# Patient Record
Sex: Male | Born: 1955
Health system: Southern US, Community
[De-identification: ages and names within clinical notes are randomized; demographics above are authoritative.]

## PROBLEM LIST (undated history)

## (undated) DIAGNOSIS — D229 Melanocytic nevi, unspecified: Secondary | ICD-10-CM

## (undated) DIAGNOSIS — C801 Malignant (primary) neoplasm, unspecified: Secondary | ICD-10-CM

## (undated) DIAGNOSIS — T7840XA Allergy, unspecified, initial encounter: Secondary | ICD-10-CM

## (undated) DIAGNOSIS — M1990 Unspecified osteoarthritis, unspecified site: Secondary | ICD-10-CM

## (undated) DIAGNOSIS — Z5189 Encounter for other specified aftercare: Secondary | ICD-10-CM

## (undated) DIAGNOSIS — G4730 Sleep apnea, unspecified: Secondary | ICD-10-CM

## (undated) DIAGNOSIS — C439 Malignant melanoma of skin, unspecified: Secondary | ICD-10-CM

## (undated) DIAGNOSIS — D049 Carcinoma in situ of skin, unspecified: Secondary | ICD-10-CM

## (undated) HISTORY — DX: Unspecified osteoarthritis, unspecified site: M19.90

## (undated) HISTORY — DX: Sleep apnea, unspecified: G47.30

## (undated) HISTORY — DX: Malignant melanoma of skin, unspecified: C43.9

## (undated) HISTORY — DX: Allergy, unspecified, initial encounter: T78.40XA

## (undated) HISTORY — DX: Carcinoma in situ of skin, unspecified: D04.9

## (undated) HISTORY — PX: COLONOSCOPY: SHX174

## (undated) HISTORY — DX: Malignant (primary) neoplasm, unspecified: C80.1

## (undated) HISTORY — DX: Melanocytic nevi, unspecified: D22.9

## (undated) HISTORY — PX: POLYPECTOMY: SHX149

## (undated) HISTORY — DX: Encounter for other specified aftercare: Z51.89

---
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
## 2001-10-14 ENCOUNTER — Emergency Department (HOSPITAL_COMMUNITY): Admission: EM | Admit: 2001-10-14 | Discharge: 2001-10-14 | Payer: Self-pay

## 2005-12-03 ENCOUNTER — Ambulatory Visit: Payer: Self-pay | Admitting: Gastroenterology

## 2005-12-16 ENCOUNTER — Encounter (INDEPENDENT_AMBULATORY_CARE_PROVIDER_SITE_OTHER): Payer: Self-pay | Admitting: *Deleted

## 2005-12-16 ENCOUNTER — Ambulatory Visit (HOSPITAL_COMMUNITY): Admission: RE | Admit: 2005-12-16 | Discharge: 2005-12-16 | Payer: Self-pay | Admitting: Gastroenterology

## 2005-12-21 ENCOUNTER — Ambulatory Visit: Payer: Self-pay | Admitting: Gastroenterology

## 2009-07-03 ENCOUNTER — Ambulatory Visit (HOSPITAL_COMMUNITY): Admission: RE | Admit: 2009-07-03 | Discharge: 2009-07-03 | Payer: Self-pay | Admitting: Specialist

## 2009-08-08 ENCOUNTER — Ambulatory Visit (HOSPITAL_BASED_OUTPATIENT_CLINIC_OR_DEPARTMENT_OTHER): Admission: RE | Admit: 2009-08-08 | Discharge: 2009-08-08 | Payer: Self-pay | Admitting: Specialist

## 2009-08-18 ENCOUNTER — Ambulatory Visit (HOSPITAL_COMMUNITY): Admission: RE | Admit: 2009-08-18 | Discharge: 2009-08-18 | Payer: Self-pay | Admitting: Specialist

## 2009-08-18 ENCOUNTER — Ambulatory Visit: Payer: Self-pay | Admitting: Surgery

## 2010-04-26 ENCOUNTER — Encounter: Payer: Self-pay | Admitting: Internal Medicine

## 2010-06-23 LAB — POCT HEMOGLOBIN-HEMACUE: Hemoglobin: 15.3 g/dL (ref 13.0–17.0)

## 2010-08-07 ENCOUNTER — Emergency Department (HOSPITAL_BASED_OUTPATIENT_CLINIC_OR_DEPARTMENT_OTHER)
Admission: EM | Admit: 2010-08-07 | Discharge: 2010-08-07 | Disposition: A | Discharge status: Home / Self Care | Payer: 59 | Attending: Emergency Medicine | Admitting: Emergency Medicine

## 2010-08-07 DIAGNOSIS — Z23 Encounter for immunization: Secondary | ICD-10-CM | POA: Insufficient documentation

## 2010-08-07 DIAGNOSIS — Z203 Contact with and (suspected) exposure to rabies: Secondary | ICD-10-CM | POA: Insufficient documentation

## 2010-08-10 ENCOUNTER — Inpatient Hospital Stay (INDEPENDENT_AMBULATORY_CARE_PROVIDER_SITE_OTHER)
Admission: RE | Admit: 2010-08-10 | Discharge: 2010-08-10 | Disposition: A | Discharge status: Home / Self Care | Payer: 59 | Source: Ambulatory Visit | Attending: Family Medicine | Admitting: Family Medicine

## 2010-08-10 DIAGNOSIS — Z23 Encounter for immunization: Secondary | ICD-10-CM

## 2010-08-14 ENCOUNTER — Inpatient Hospital Stay (INDEPENDENT_AMBULATORY_CARE_PROVIDER_SITE_OTHER)
Admission: RE | Admit: 2010-08-14 | Discharge: 2010-08-14 | Disposition: A | Discharge status: Home / Self Care | Payer: 59 | Source: Ambulatory Visit

## 2010-08-14 DIAGNOSIS — Z23 Encounter for immunization: Secondary | ICD-10-CM

## 2010-08-21 ENCOUNTER — Inpatient Hospital Stay (INDEPENDENT_AMBULATORY_CARE_PROVIDER_SITE_OTHER)
Admission: RE | Admit: 2010-08-21 | Discharge: 2010-08-21 | Disposition: A | Discharge status: Home / Self Care | Payer: 59 | Source: Ambulatory Visit

## 2010-08-21 DIAGNOSIS — Z23 Encounter for immunization: Secondary | ICD-10-CM

## 2010-08-21 NOTE — Assessment & Plan Note (Signed)
[facility]                           GASTROENTEROLOGY OFFICE NOTE   Lance, Davis                     MRN:          [id number]  DATE:12/03/2005                            DOB:          1955/09/29    REASON FOR REFERRAL:  Dr. Chilton Si asked me to evaluate Lance Davis I  consultation regarding intermittent rectal bleeding, heme-positive stools.   HISTORY OF PRESENT ILLNESS:  Lance Davis is a very pleasant 55 year old man  who has had minor intermittent rectal bleeding since he was in high school.  He has attributed this to hemorrhoids, which he does periodically feel.  He  had a CBC and comprehensive metabolic profile done recently.  The CBC was  completely normal.  He did have a mildly elevated total bilirubin, but  otherwise his liver tests were completely normal.   He has no troubles with constipation or diarrhea, and aside from the  intermittent minor rectal bleeding, he has had no more dramatic bleeding.   REVIEW OF SYSTEMS:  Notable for a stable weight, but otherwise essentially  normal, and is available on his nursing intake sheet.   PAST MEDICAL HISTORY:  1. Kidney stones 15 years ago.  2. Hemorrhoid surgery 20 years ago.   CURRENT MEDICATIONS:  None.   ALLERGIES:  No known drug allergies.   SOCIAL HISTORY:  Married with 2 sons, works as a Photographer.  Nonsmoker, drinks a glass of wine once daily.   FAMILY HISTORY:  Mother with diabetes, no colon cancer, colon polyps in the  family.   PHYSICAL EXAMINATION:  6 feet 3 inches, 271 pounds.  Blood pressure 126/80,  pulse 62.  CONSTITUTIONAL:  Generally well-appearing.  NEUROLOGIC:  Alert and oriented x3.  EYES:  Extraocular movements intact.  MOUTH:  Oropharynx moist, no lesions.  NECK:  Supple, no lymphadenopathy.  CARDIOVASCULAR:  Heart regular rate and rhythm.  LUNGS:  Clear to auscultation bilaterally.  ABDOMEN:  Soft, nontender, nondistended, normal bowel sounds.  EXTREMITIES:  No lower extremity edema.  SKIN:  No rashes or lesions on visible extremities.   ASSESSMENT AND PLAN:  55 year old man with intermittent minor rectal  bleeding.   This likely is hemorrhoidal bleeding, but we should proceed with full  colonoscopy at his soonest convenience to make sure there are no other  causes, plus he is due for a screening for colorectal cancer.  I see no  reasons for any further blood tests or imaging studies prior to that  colonoscopy.                                   Rachael Fee, MD   DPJ/MedQ  DD:  12/03/2005  DT:  12/03/2005  Job #:  [id number]   cc:   Erskine Speed, MD

## 2010-12-09 ENCOUNTER — Encounter: Payer: Self-pay | Admitting: Gastroenterology

## 2011-07-14 ENCOUNTER — Encounter (HOSPITAL_COMMUNITY): Payer: Self-pay | Admitting: Anesthesiology

## 2011-07-14 ENCOUNTER — Encounter (HOSPITAL_COMMUNITY): Admission: EM | Disposition: A | Discharge status: Home / Self Care | Payer: Self-pay | Source: Home / Self Care

## 2011-07-14 ENCOUNTER — Encounter (HOSPITAL_COMMUNITY): Payer: Self-pay | Admitting: *Deleted

## 2011-07-14 ENCOUNTER — Emergency Department (HOSPITAL_COMMUNITY)
Admission: EM | Admit: 2011-07-14 | Discharge: 2011-07-15 | Disposition: A | Discharge status: Home / Self Care | Payer: 59 | Attending: Orthopedic Surgery | Admitting: Orthopedic Surgery

## 2011-07-14 ENCOUNTER — Emergency Department (HOSPITAL_COMMUNITY): Payer: 59 | Admitting: Anesthesiology

## 2011-07-14 ENCOUNTER — Emergency Department (HOSPITAL_COMMUNITY): Payer: 59

## 2011-07-14 DIAGNOSIS — Y92009 Unspecified place in unspecified non-institutional (private) residence as the place of occurrence of the external cause: Secondary | ICD-10-CM | POA: Insufficient documentation

## 2011-07-14 DIAGNOSIS — IMO0002 Reserved for concepts with insufficient information to code with codable children: Secondary | ICD-10-CM | POA: Insufficient documentation

## 2011-07-14 DIAGNOSIS — S6980XA Other specified injuries of unspecified wrist, hand and finger(s), initial encounter: Secondary | ICD-10-CM | POA: Insufficient documentation

## 2011-07-14 DIAGNOSIS — W298XXA Contact with other powered powered hand tools and household machinery, initial encounter: Secondary | ICD-10-CM | POA: Insufficient documentation

## 2011-07-14 DIAGNOSIS — S61209A Unspecified open wound of unspecified finger without damage to nail, initial encounter: Secondary | ICD-10-CM | POA: Insufficient documentation

## 2011-07-14 DIAGNOSIS — S6990XA Unspecified injury of unspecified wrist, hand and finger(s), initial encounter: Secondary | ICD-10-CM | POA: Insufficient documentation

## 2011-07-14 HISTORY — PX: I & D EXTREMITY: SHX5045

## 2011-07-14 HISTORY — PX: TENDON REPAIR: SHX5111

## 2011-07-14 SURGERY — IRRIGATION AND DEBRIDEMENT EXTREMITY
Anesthesia: General | Site: Finger | Laterality: Left | Wound class: Dirty or Infected

## 2011-07-14 MED ORDER — HYDROMORPHONE HCL PF 1 MG/ML IJ SOLN
1.0000 mg | Freq: Once | INTRAMUSCULAR | Status: AC
  Administered 2011-07-14: 1 mg via INTRAVENOUS
  Filled 2011-07-14: qty 1

## 2011-07-14 MED ORDER — LACTATED RINGERS IV SOLN
INTRAVENOUS | Status: DC | PRN
  Administered 2011-07-14 – 2011-07-15 (×2): via INTRAVENOUS

## 2011-07-14 MED ORDER — CEFAZOLIN SODIUM 1-5 GM-% IV SOLN
INTRAVENOUS | Status: AC
  Filled 2011-07-14: qty 50

## 2011-07-14 MED ORDER — CEPHALEXIN 500 MG PO CAPS: 500.0000 mg | ORAL_CAPSULE | Freq: Four times a day (QID) | ORAL | Status: AC

## 2011-07-14 MED ORDER — MIDAZOLAM HCL 5 MG/5ML IJ SOLN
INTRAMUSCULAR | Status: DC | PRN
  Administered 2011-07-14: 2 mg via INTRAVENOUS

## 2011-07-14 MED ORDER — FENTANYL CITRATE 0.05 MG/ML IJ SOLN
INTRAMUSCULAR | Status: DC | PRN
  Administered 2011-07-14 (×3): 50 ug via INTRAVENOUS
  Administered 2011-07-14: 100 ug via INTRAVENOUS

## 2011-07-14 MED ORDER — SUCCINYLCHOLINE CHLORIDE 20 MG/ML IJ SOLN
INTRAMUSCULAR | Status: DC | PRN
  Administered 2011-07-14: 120 mg via INTRAVENOUS

## 2011-07-14 MED ORDER — DROPERIDOL 2.5 MG/ML IJ SOLN
INTRAMUSCULAR | Status: DC | PRN
  Administered 2011-07-14: .6 mg via INTRAVENOUS

## 2011-07-14 MED ORDER — CEFAZOLIN SODIUM 1-5 GM-% IV SOLN
1.0000 g | Freq: Once | INTRAVENOUS | Status: AC
  Administered 2011-07-14: 1 g via INTRAVENOUS

## 2011-07-14 MED ORDER — DOCUSATE SODIUM 100 MG PO CAPS: 100.0000 mg | ORAL_CAPSULE | Freq: Two times a day (BID) | ORAL | Status: AC

## 2011-07-14 MED ORDER — CEFAZOLIN SODIUM 1-5 GM-% IV SOLN
1.0000 g | Freq: Once | INTRAVENOUS | Status: AC
  Administered 2011-07-14: 1 g via INTRAVENOUS
  Filled 2011-07-14: qty 50

## 2011-07-14 MED ORDER — OXYCODONE-ACETAMINOPHEN 10-325 MG PO TABS: 1.0000 | ORAL_TABLET | ORAL | Status: AC | PRN

## 2011-07-14 MED ORDER — PROPOFOL 10 MG/ML IV BOLUS
INTRAVENOUS | Status: DC | PRN
  Administered 2011-07-14: 130 mg via INTRAVENOUS

## 2011-07-14 SURGICAL SUPPLY — 61 items
BANDAGE CONFORM 2  STR LF (GAUZE/BANDAGES/DRESSINGS) IMPLANT
BANDAGE ELASTIC 3 VELCRO ST LF (GAUZE/BANDAGES/DRESSINGS) ×3 IMPLANT
BANDAGE ELASTIC 4 VELCRO ST LF (GAUZE/BANDAGES/DRESSINGS) ×2 IMPLANT
BANDAGE GAUZE 4  KLING STR (GAUZE/BANDAGES/DRESSINGS) ×1 IMPLANT
BANDAGE GAUZE ELAST BULKY 4 IN (GAUZE/BANDAGES/DRESSINGS) ×2 IMPLANT
BNDG CMPR 9X4 STRL LF SNTH (GAUZE/BANDAGES/DRESSINGS) ×1
BNDG COHESIVE 1X5 TAN STRL LF (GAUZE/BANDAGES/DRESSINGS) IMPLANT
BNDG ESMARK 4X9 LF (GAUZE/BANDAGES/DRESSINGS) ×2 IMPLANT
CLOTH BEACON ORANGE TIMEOUT ST (SAFETY) ×2 IMPLANT
CORDS BIPOLAR (ELECTRODE) ×2 IMPLANT
COVER SURGICAL LIGHT HANDLE (MISCELLANEOUS) ×2 IMPLANT
CUFF TOURNIQUET SINGLE 18IN (TOURNIQUET CUFF) ×2 IMPLANT
CUFF TOURNIQUET SINGLE 24IN (TOURNIQUET CUFF) IMPLANT
DRAIN PENROSE 1/4X12 LTX STRL (WOUND CARE) IMPLANT
DRAPE SURG 17X23 STRL (DRAPES) ×2 IMPLANT
DRSG ADAPTIC 3X8 NADH LF (GAUZE/BANDAGES/DRESSINGS) ×2 IMPLANT
ELECT REM PT RETURN 9FT ADLT (ELECTROSURGICAL)
ELECTRODE REM PT RTRN 9FT ADLT (ELECTROSURGICAL) IMPLANT
GAUZE SPONGE 2X2 8PLY STRL LF (GAUZE/BANDAGES/DRESSINGS) IMPLANT
GAUZE XEROFORM 1X8 LF (GAUZE/BANDAGES/DRESSINGS) ×1 IMPLANT
GAUZE XEROFORM 5X9 LF (GAUZE/BANDAGES/DRESSINGS) IMPLANT
GLOVE BIOGEL PI IND STRL 8.5 (GLOVE) ×1 IMPLANT
GLOVE BIOGEL PI INDICATOR 8.5 (GLOVE) ×1
GLOVE SURG ORTHO 8.0 STRL STRW (GLOVE) ×2 IMPLANT
GOWN PREVENTION PLUS XLARGE (GOWN DISPOSABLE) ×2 IMPLANT
GOWN STRL NON-REIN LRG LVL3 (GOWN DISPOSABLE) ×4 IMPLANT
HANDPIECE INTERPULSE COAX TIP (DISPOSABLE)
KIT BASIN OR (CUSTOM PROCEDURE TRAY) ×2 IMPLANT
KIT ROOM TURNOVER OR (KITS) ×2 IMPLANT
MANIFOLD NEPTUNE II (INSTRUMENTS) ×2 IMPLANT
NDL HYPO 25GX1X1/2 BEV (NEEDLE) IMPLANT
NEEDLE HYPO 25GX1X1/2 BEV (NEEDLE) ×2 IMPLANT
NS IRRIG 1000ML POUR BTL (IV SOLUTION) ×2 IMPLANT
PACK ORTHO EXTREMITY (CUSTOM PROCEDURE TRAY) ×2 IMPLANT
PAD ARMBOARD 7.5X6 YLW CONV (MISCELLANEOUS) ×3 IMPLANT
PAD CAST 4YDX4 CTTN HI CHSV (CAST SUPPLIES) ×1 IMPLANT
PADDING CAST COTTON 4X4 STRL (CAST SUPPLIES) ×2
SET HNDPC FAN SPRY TIP SCT (DISPOSABLE) IMPLANT
SOAP 2 % CHG 4 OZ (WOUND CARE) ×2 IMPLANT
SPECIMEN JAR SMALL (MISCELLANEOUS) ×1 IMPLANT
SPONGE GAUZE 2X2 STER 10/PKG (GAUZE/BANDAGES/DRESSINGS)
SPONGE GAUZE 4X4 12PLY (GAUZE/BANDAGES/DRESSINGS) ×2 IMPLANT
SPONGE LAP 18X18 X RAY DECT (DISPOSABLE) ×2 IMPLANT
SPONGE LAP 4X18 X RAY DECT (DISPOSABLE) ×1 IMPLANT
SUCTION FRAZIER TIP 10 FR DISP (SUCTIONS) ×2 IMPLANT
SUT ETHIBOND 4 0 TF (SUTURE) ×1 IMPLANT
SUT ETHILON 4 0 PS 2 18 (SUTURE) ×1 IMPLANT
SUT ETHILON 5 0 P 3 18 (SUTURE)
SUT MERSILENE 4 0 P 3 (SUTURE) IMPLANT
SUT NYLON ETHILON 5-0 P-3 1X18 (SUTURE) ×1 IMPLANT
SUT PROLENE 4 0 PS 2 18 (SUTURE) ×2 IMPLANT
SUT VIC AB 2-0 CT1 27 (SUTURE) ×2
SUT VIC AB 2-0 CT1 TAPERPNT 27 (SUTURE) IMPLANT
SYR CONTROL 10ML LL (SYRINGE) ×1 IMPLANT
TOWEL OR 17X24 6PK STRL BLUE (TOWEL DISPOSABLE) ×2 IMPLANT
TOWEL OR 17X26 10 PK STRL BLUE (TOWEL DISPOSABLE) ×2 IMPLANT
TUBE ANAEROBIC SPECIMEN COL (MISCELLANEOUS) IMPLANT
TUBE CONNECTING 12X1/4 (SUCTIONS) ×2 IMPLANT
UNDERPAD 30X30 INCONTINENT (UNDERPADS AND DIAPERS) ×2 IMPLANT
WATER STERILE IRR 1000ML POUR (IV SOLUTION) ×1 IMPLANT
YANKAUER SUCT BULB TIP NO VENT (SUCTIONS) ×2 IMPLANT

## 2011-07-14 NOTE — ED Notes (Signed)
MD Melvyn Novas, at bedside, consent signed and pt denies any further questions.

## 2011-07-14 NOTE — ED Notes (Signed)
Pt reports using a skill saw this afternoon and cut his (L) middle and ring finger with the blade.  Pt bleeding-wrapped with pressure dressing after ring removed using ring cutter.  Denies LOC, denies hitting head.  Pt has sensation, cap refill, movement in hand.  Dr. Shelle Iron called in triage to say that Dr. Orlan Leavens, is in the OR and will be down to evaluate pt.  Pt has ice on hand.  No distress noted.

## 2011-07-14 NOTE — ED Notes (Signed)
Spoke with Dr. Melvyn Novas in OR for clarification of antibiotics to be given with verbal order for Ancef 2gm IV piggyback and ok for pt to have pain medications at this time.

## 2011-07-14 NOTE — Anesthesia Preprocedure Evaluation (Addendum)
Anesthesia Evaluation  Patient identified by MRN, date of birth, ID band Patient awake    Reviewed: Allergy & Precautions, H&P , NPO status , Patient's Chart, lab work & pertinent test results  Airway Mallampati: I TM Distance: >3 FB Neck ROM: Full    Dental   Pulmonary          Cardiovascular     Neuro/Psych    GI/Hepatic   Endo/Other    Renal/GU      Musculoskeletal   Abdominal   Peds  Hematology   Anesthesia Other Findings   Reproductive/Obstetrics                           Anesthesia Physical Anesthesia Plan  ASA: I and Emergent  Anesthesia Plan: General   Post-op Pain Management:    Induction: Intravenous, Rapid sequence and Cricoid pressure planned  Airway Management Planned: Oral ETT  Additional Equipment:   Intra-op Plan:   Post-operative Plan: Extubation in OR  Informed Consent: I have reviewed the patients History and Physical, chart, labs and discussed the procedure including the risks, benefits and alternatives for the proposed anesthesia with the patient or authorized representative who has indicated his/her understanding and acceptance.     Plan Discussed with: CRNA and Surgeon  Anesthesia Plan Comments:         Anesthesia Quick Evaluation  

## 2011-07-14 NOTE — ED Notes (Signed)
Plan of care is updated with verbal understanding. Pt reports decrease in pain, has palpable radial pulse, CNS intact and good capillary refill to left hand.

## 2011-07-14 NOTE — ED Notes (Signed)
Dr. Melvyn Novas at bedside, dressed pt injury to left hand.

## 2011-07-14 NOTE — Discharge Instructions (Signed)
KEEP BANDAGE CLEAN AND DRY CALL OFFICE FOR F/U APPT [phone] IN 6 DAYS KEEP HAND ELEVATED ABOVE HEART OK TO APPLY ICE TO OPERATIVE AREA CONTACT OFFICE IF ANY WORSENING PAIN OR CONCERNS. OR DR. Cecila Satcher AT [phone] CELL

## 2011-07-14 NOTE — ED Notes (Signed)
Pt will be transported to OR by RN and tech at this time. Pt is stable upon transport, report will be given upon arrival to OR.

## 2011-07-14 NOTE — H&P (Signed)
Lance Davis is an 56 y.o. male.   Chief Complaint: SKILL SAW INJURY TO LEFT LONG AND RING FINGERS HPI: PT WORKING AT HOME SUSTAINED INJURY TO DORSUM OF RING AND LONG FINGERS PRESENTED TO ED WITH OPEN INJURIES TO LONG AND RING PT'S TD UTD RECEIVED ABX IN ED  History reviewed. No pertinent past medical history.  History reviewed. No pertinent past surgical history.  History reviewed. No pertinent family history. Social History:  reports that he has never smoked. He does not have any smokeless tobacco history on file. He reports that he drinks alcohol. He reports that he does not use illicit drugs.  Allergies: No Known Allergies  Medications Prior to Admission  Medication Dose Route Frequency Provider Last Rate Last Dose  . ceFAZolin (ANCEF) 1-5 GM-% IVPB           . ceFAZolin (ANCEF) IVPB 1 g/50 mL premix  1 g Intravenous Once Angus Seller, PA   1 g at 07/14/11 2119  . ceFAZolin (ANCEF) IVPB 1 g/50 mL premix  1 g Intravenous Once Angus Seller, PA   1 g at 07/14/11 2157  . HYDROmorphone (DILAUDID) injection 1 mg  1 mg Intravenous Once Angus Seller, PA   1 mg at 07/14/11 2157   No current outpatient prescriptions on file as of 07/14/2011.    No results found for this or any previous visit (from the past 48 hour(s)). Dg Hand Complete Left  07/14/2011  *RADIOLOGY REPORT*  Clinical Data: Trauma.  Laceration to the third and fourth digits.  LEFT HAND - COMPLETE 3+ VIEW  Comparison: None.  Findings:  Prominent soft tissue lacerations are present at the level of the PIP joints of the third and fourth digits.  There is a comminuted fracture along the radial aspect of the distal proximal phalanx in the third digit.  No other acute bone or soft tissue abnormalities present.  No radiopaque foreign body is evident.  IMPRESSION:  1.  Comminuted fracture along the radial aspect of the distal proximal phalanx in the third digit. 2.  Prominent soft tissue lacerations at the level of the PIP joints  in the third and fourth digits. 3.  No other acute osseous abnormality.  Original Report Authenticated By: Jamesetta Orleans. MATTERN, M.D.    NO RECENT ILLNESSES OR HOSPITALIZATIONS  Blood pressure 154/95, pulse 71, temperature 98.4 F (36.9 C), temperature source Oral, resp. rate 20, SpO2 94.00%. General Appearance:  Alert, cooperative, no distress, appears stated age  Head:  Normocephalic, without obvious abnormality, atraumatic  Eyes:  Pupils equal, conjunctiva/corneas clear,         Throat: Lips, mucosa, and tongue normal; teeth and gums normal  Neck: No visible masses     Lungs:   respirations unlabored  Chest Wall:  No tenderness or deformity  Heart:  Regular rate and rhythm,  Abdomen:   Soft, non-tender,         Extremities: LEFT HAND: OPEN WOUNDS TO DORSUM OF LONG AND RING FINGERS OVER PIP JOINTS, ABLE TO FLEX AND EXTEND ALL DIGITS, FINGERS WARM WELL PERFUSED NO INJURY TO INDEX/SMALL/THUMB  Pulses: 2+ and symmetric  Skin: Skin color, texture, turgor normal, no rashes or lesions     Neurologic: Normal    Assessment/Plan Left long and ring finger skill saw injuries with tendon injury and open long finger pip joint, and open fracture  TO OR FOR IRRIGATION AND DEBRIDEMENT AND REPAIR AS INDICATED  R/B/A DISCUSSED WITH PT IN ED.  PT  VOICED UNDERSTANDING OF PLAN CONSENT SIGNED DAY OF SURGERY PT SEEN AND EXAMINED PRIOR TO OPERATIVE PROCEDURE/DAY OF SURGERY SITE MARKED. QUESTIONS ANSWERED WILL REMAIN AN INPATIENT FOLLOWING SURGERY  Sharma Covert 07/14/2011, 10:43 PM

## 2011-07-15 ENCOUNTER — Encounter (HOSPITAL_COMMUNITY): Payer: Self-pay | Admitting: Orthopedic Surgery

## 2011-07-15 MED ORDER — HYDROMORPHONE HCL PF 1 MG/ML IJ SOLN
0.2500 mg | INTRAMUSCULAR | Status: DC | PRN
  Administered 2011-07-15 (×4): 0.5 mg via INTRAVENOUS

## 2011-07-15 MED ORDER — HYDROMORPHONE HCL PF 1 MG/ML IJ SOLN
INTRAMUSCULAR | Status: AC
  Filled 2011-07-15: qty 1

## 2011-07-15 MED ORDER — LABETALOL HCL 5 MG/ML IV SOLN
5.0000 mg | Freq: Once | INTRAVENOUS | Status: AC
  Administered 2011-07-15: 5 mg via INTRAVENOUS

## 2011-07-15 MED ORDER — LABETALOL HCL 5 MG/ML IV SOLN
INTRAVENOUS | Status: AC
  Administered 2011-07-15: 5 mg via INTRAVENOUS
  Filled 2011-07-15: qty 4

## 2011-07-15 MED ORDER — ONDANSETRON HCL 4 MG/2ML IJ SOLN: 4.0000 mg | Freq: Once | INTRAMUSCULAR | Status: DC | PRN

## 2011-07-15 MED ORDER — BUPIVACAINE HCL (PF) 0.25 % IJ SOLN
INTRAMUSCULAR | Status: DC | PRN
  Administered 2011-07-15: 10 mL

## 2011-07-15 NOTE — Anesthesia Procedure Notes (Signed)
Procedure Name: Intubation Date/Time: 07/15/2011 11:28 PM Performed by: Alanda Amass A Pre-anesthesia Checklist: Patient identified, Timeout performed, Emergency Drugs available, Patient being monitored and Suction available Patient Re-evaluated:Patient Re-evaluated prior to inductionOxygen Delivery Method: Circle system utilized Preoxygenation: Pre-oxygenation with 100% oxygen Intubation Type: IV induction, Rapid sequence and Cricoid Pressure applied Laryngoscope Size: Miller and 2 Grade View: Grade II Tube type: Oral Tube size: 7.5 mm Number of attempts: 2 Airway Equipment and Method: Stylet Placement Confirmation: ETT inserted through vocal cords under direct vision,  breath sounds checked- equal and bilateral and positive ETCO2 Secured at: 22 cm Tube secured with: Tape Dental Injury: Teeth and Oropharynx as per pre-operative assessment  Comments: First look with MAC 4, poor view, Second look with miller 2, good view and ett placed

## 2011-07-15 NOTE — Anesthesia Postprocedure Evaluation (Signed)
Anesthesia Post Note  Patient: Lance Davis  Procedure(s) Performed: Procedure(s) (LRB): IRRIGATION AND DEBRIDEMENT EXTREMITY (Left) TENDON REPAIR (Left)  Anesthesia type: general  Patient location: PACU  Post pain: Pain level controlled  Post assessment: Patient's Cardiovascular Status Stable  Last Vitals:  Filed Vitals:   07/14/11 2225  BP: 154/95  Pulse: 71  Temp: 36.9 C  Resp: 20    Post vital signs: Reviewed and stable  Level of consciousness: sedated  Complications: No apparent anesthesia complications

## 2011-07-15 NOTE — Transfer of Care (Signed)
Immediate Anesthesia Transfer of Care Note  Patient: Lance Davis  Procedure(s) Performed: Procedure(s) (LRB): IRRIGATION AND DEBRIDEMENT EXTREMITY (Left) TENDON REPAIR (Left)  Patient Location: PACU  Anesthesia Type: General  Level of Consciousness: awake  Airway & Oxygen Therapy: Patient Spontanous Breathing and Patient connected to nasal cannula oxygen  Post-op Assessment: Report given to PACU RN and Post -op Vital signs reviewed and stable  Post vital signs: Reviewed and stable  Complications: No apparent anesthesia complications

## 2011-07-15 NOTE — Anesthesia Postprocedure Evaluation (Signed)
Anesthesia Post Note  Patient: Lance Davis  Procedure(s) Performed: Procedure(s) (LRB): IRRIGATION AND DEBRIDEMENT EXTREMITY (Left) TENDON REPAIR (Left)  Anesthesia type: general  Patient location: PACU  Post pain: Pain level controlled  Post assessment: Patient's Cardiovascular Status Stable  Last Vitals:  Filed Vitals:   07/15/11 0121  BP: 169/94  Pulse: 60  Temp:   Resp: 10    Post vital signs: Reviewed and stable  Level of consciousness: sedated  Complications: No apparent anesthesia complications

## 2011-07-15 NOTE — Op Note (Signed)
NAMEHALDON, CARLEY NO.:  [id number]  MEDICAL RECORD NO.:  [id number]  LOCATION:  [facility]                         FACILITY:  [facility]  PHYSICIAN:  Madelynn Done, MD  DATE OF BIRTH:  Jan 16, 1956  DATE OF PROCEDURE:  07/15/2011 DATE OF DISCHARGE:  07/15/2011                              OPERATIVE REPORT   PREOPERATIVE DIAGNOSES: 1. Left long finger open wound with injury to the extensor mechanism     and open proximal phalanx fracture. 2. Left ring finger laceration with tendon involvement.  POSTOPERATIVE DIAGNOSES: 1. Left long finger open wound with injury to the extensor mechanism     and open proximal phalanx fracture. 2. Left ring finger laceration with tendon involvement.  ATTENDING PHYSICIAN:  Madelynn Done, MD who was scrubbed and present for the entire procedure.  ASSISTANT SURGEON:  None.  ANESTHESIA:  General via LMA.  SURGICAL PROCEDURE: 1. Left long finger debridement of skin, subcutaneous tissue, and bone     associated with open proximal interphalangeal joint fracture     involving the articular surface. 2. Open treatment of left long finger open proximal phalanx fracture     without internal fixation involving the articular surface. 3. Left long finger extensor tendon repair, primary extensor tendon     repair. 4. Left long finger laceration repair, 3 cm traumatic laceration. 5. Left ring finger extensor tendon repair, zone 3. 6. Left ring finger traumatic laceration repair, 2 cm.  ANESTHESIA:  General via LMA.  TOURNIQUET TIME:  Less than 30 minutes.  SURGICAL EQUIPMENTS:  None.  SURGICAL INDICATIONS:  Mr. Bushway is a 56 year old gentleman who was working at home on a skill saw sustaining an injury to the dorsal aspect of his long and ring fingers.  The patient was seen and evaluated in the ER and it was recommended that he undergo the above procedure.  Risks, benefits, and alternatives were discussed in detail with the patient  and signed informed consent was obtained.  Risks include, but not limited to bleeding, infection, damage to nearby nerves, arteries, or tendons, loss of motion of the wrist and digits, tendon rupture, and need for further surgical intervention.  DESCRIPTION OF PROCEDURE:  The patient was properly identified in the preop holding area and a mark with a permanent marker made on the left long and ring fingers to indicate the correct operative site.  The patient was then brought back to the operating room and placed supine on the anesthesia room table where general anesthesia was administered. The patient tolerated this well.  A well-padded tourniquet was then placed on the left brachium and sealed with 1000 drape.  The left upper extremity was then prepped and draped in normal sterile fashion.  Time- out was called, correct site was identified, and the procedure was then begun.  Attention was then turned to the left long finger where the 3 cm laceration was extended proximally.  Dissection was then carried down to the extensor mechanism where debridement of the skin, subcutaneous tissue, and open fracture site was then carried out.  Excisional debridement was then carried out on the right with rongeurs and curettes.  Open debridement of the  joint was then carried out.  The patient was missing a portion of the articular surface of the proximal phalanx.  This bone was not present in the wound, and the partial portion of the radial collateral ligament was injured, but there was no gross instability in the joint.  Therefore internal stabilization of the joint was not indicated.  Copious wound irrigation was done of the joint.  Following thorough wound irrigation, the extensor tendon was then repaired using several figure-of-eight 4-0 Ethibond sutures.  After repair of the extensor tendon, the skin was then closed, traumatic laceration then closed with simple Prolene sutures.  Attention was  then turned to the ring finger where the laceration was extended proximally. Dissection carried down through the skin and subcutaneous tissue. Excisional debridement was then carried out of the zone 3.  Partial extensor tendon was then repaired using several figure-of-eight Ethibond sutures.  Copious wound irrigation done throughout.  The traumatic laceration was then closed with simple Prolene suture.  Adaptic dressing, 10 mL of 0.25% Marcaine infiltrated between both fingers. Adaptic dressing and sterile compressive bandage was then applied.  The patient was then placed in a well-padded volar splint, extubated, and taken to recovery room in good condition.  POSTOPERATIVE PLAN:  The patient will be discharged home, will be seen back in the office in approximately 6 days for wound check.  He was sent down to his therapist to begin a zone 3 extensor tendon repair protocol for the long and ring fingers.  Guarded prognosis for the long finger given the open joint, the missing bone to the proximal phalanx, and the soft tissue injury to the skin.  My concern is that if the skin does not survive, then the patient may require further operative intervention to restore the skin coverage of the dorsum of the finger.  I will watch this closely.     Madelynn Done, MD     FWO/MEDQ  D:  07/15/2011  T:  07/15/2011  Job:  [id number]

## 2011-07-15 NOTE — Brief Op Note (Signed)
07/14/2011 - 07/15/2011  12:26 AM  PATIENT:  Lance Davis  55 y.o. male  PRE-OPERATIVE DIAGNOSIS:  Saw Injury to fingers  POST-OPERATIVE DIAGNOSIS:  Saw Injury to fingers  PROCEDURE:  Procedure(s) (LRB): IRRIGATION AND DEBRIDEMENT EXTREMITY (Left) TENDON REPAIR (Left)  SURGEON:  Surgeon(s) and Role:    * Sharma Covert, MD - Primary  PHYSICIAN ASSISTANT:   ASSISTANTS: none   ANESTHESIA:   general  EBL:  Total I/O In: 1100 [I.V.:1100] Out: 10 [Blood:10]  BLOOD ADMINISTERED:none  DRAINS: none   LOCAL MEDICATIONS USED:  MARCAINE     SPECIMEN:  No Specimen  DISPOSITION OF SPECIMEN:  N/A  COUNTS:  YES  TOURNIQUET:   Total Tourniquet Time Documented: Upper Arm (Left) - 22 minutes  DICTATION: .Other Dictation: Dictation Number [id number]  PLAN OF CARE: Discharge to home after PACU  PATIENT DISPOSITION:  PACU - hemodynamically stable.   Delay start of Pharmacological VTE agent (>24hrs) due to surgical blood loss or risk of bleeding: not applicable

## 2011-11-16 ENCOUNTER — Ambulatory Visit (HOSPITAL_BASED_OUTPATIENT_CLINIC_OR_DEPARTMENT_OTHER): Payer: 59 | Attending: Internal Medicine | Admitting: Radiology

## 2011-11-16 VITALS — Ht 75.0 in | Wt 270.0 lb

## 2011-11-16 DIAGNOSIS — G479 Sleep disorder, unspecified: Secondary | ICD-10-CM

## 2011-11-16 DIAGNOSIS — G4733 Obstructive sleep apnea (adult) (pediatric): Secondary | ICD-10-CM | POA: Insufficient documentation

## 2011-11-16 DIAGNOSIS — G4737 Central sleep apnea in conditions classified elsewhere: Secondary | ICD-10-CM | POA: Insufficient documentation

## 2011-11-21 DIAGNOSIS — G4733 Obstructive sleep apnea (adult) (pediatric): Secondary | ICD-10-CM

## 2011-11-22 NOTE — Procedures (Signed)
Lance Davis, Lance Davis              ACCOUNT NO.:  [id number]  MEDICAL RECORD NO.:  [id number]          PATIENT TYPE:  OUT  LOCATION:  SLEEP CENTER                 FACILITY:  [facility]  PHYSICIAN:  Ilaria Much D. Maple Hudson, MD, FCCP, FACPDATE OF BIRTH:  10/08/1954  DATE OF STUDY:  11/16/2011                           NOCTURNAL POLYSOMNOGRAM  REFERRING PHYSICIAN:  Theressa Millard, M.D.  INDICATION FOR STUDY:  Hypersomnia with sleep apnea.  EPWORTH SLEEPINESS SCORE:  9/24.  BMI 33.7, weight 270 pounds, height 75 inches, neck 17.5 inches.  MEDICATIONS:  Home medications are charted and reviewed.  SLEEP ARCHITECTURE:  Total sleep time 177 minutes with sleep efficiency 47.6%.  Stage I was 48%, stage II 35.6%, stage III absent, REM 16.4% of total sleep time.  Sleep latency, 67 minutes.  REM latency, 110.5 minutes. Awake after sleep onset 136.5 minutes.  Arousal index 47.5.  No bedtime medication taken.  Sleep was marked by persistent fragmentation and spontaneous waking throughout the night.  RESPIRATORY DATA:  Apnea-hypopnea index (AHI) 35.6 per hour.  A total of 105 events was scored including 10 obstructive apneas, 71 central apneas, 24 hypopneas.  Events were not positional.  REM AHI 10.3 per hour.  He was not able to maintain sleep sufficiently to meet protocol requirements for initiation of split protocol, CPAP titration on this study night.  OXYGEN DATA:  Moderate snoring with oxygen desaturation to a nadir of 89% and mean oxygen saturation through the study of 94.5% on room air.  CARDIAC DATA:  Sinus rhythm.  MOVEMENT-PARASOMNIA:  A few limb jerks were counted with insignificant impact on sleep. Bathroom x1.  IMPRESSIONS-RECOMMENDATIONS: 1. Sleep was markedly fragmented throughout the night except the     interval between 1:30 and 2:30 a.m.  There may be benefit to     treating as an insomnia. 2. Severe central and obstructive sleep apnea/hypopnea syndrome, AHI     35.6 per hour.  The  majority of events were scored as central     apneas, which can suggest delay in cerebral blood flow. 3. His lack of sustained sleep prevented him from meeting protocol     requirements for initiation of split protocol CPAP titration on     this study night.  Consider return for dedicated CPAP titration     study, bringing an     appropriate sedative hypnotic.  Consider if the predominance of     central events may correlate with cardiovascular disease.     Trachelle Low D. Maple Hudson, MD, [facility], FACP Diplomate, American Board of Sleep Medicine    CDY/MEDQ  D:  11/21/2011 14:10:52  T:  11/22/2011 02:35:00  Job:  [id number]

## 2011-11-30 ENCOUNTER — Encounter: Payer: Self-pay | Admitting: Gastroenterology

## 2011-12-22 ENCOUNTER — Other Ambulatory Visit: Payer: 59 | Admitting: Gastroenterology

## 2011-12-23 ENCOUNTER — Ambulatory Visit (AMBULATORY_SURGERY_CENTER): Payer: 59 | Admitting: *Deleted

## 2011-12-23 VITALS — Ht 75.0 in | Wt 279.0 lb

## 2011-12-23 DIAGNOSIS — Z1211 Encounter for screening for malignant neoplasm of colon: Secondary | ICD-10-CM

## 2011-12-23 MED ORDER — MOVIPREP 100 G PO SOLR: ORAL | Status: DC

## 2012-01-04 ENCOUNTER — Ambulatory Visit (AMBULATORY_SURGERY_CENTER): Payer: 59 | Admitting: Gastroenterology

## 2012-01-04 ENCOUNTER — Encounter: Payer: Self-pay | Admitting: Gastroenterology

## 2012-01-04 VITALS — BP 133/93 | HR 57 | Temp 97.4°F | Resp 17 | Ht 75.0 in | Wt 279.0 lb

## 2012-01-04 DIAGNOSIS — D126 Benign neoplasm of colon, unspecified: Secondary | ICD-10-CM

## 2012-01-04 DIAGNOSIS — K5730 Diverticulosis of large intestine without perforation or abscess without bleeding: Secondary | ICD-10-CM

## 2012-01-04 DIAGNOSIS — Z1211 Encounter for screening for malignant neoplasm of colon: Secondary | ICD-10-CM

## 2012-01-04 DIAGNOSIS — K644 Residual hemorrhoidal skin tags: Secondary | ICD-10-CM

## 2012-01-04 MED ORDER — SODIUM CHLORIDE 0.9 % IV SOLN: 500.0000 mL | INTRAVENOUS | Status: DC

## 2012-01-04 NOTE — Patient Instructions (Signed)
YOU HAD AN ENDOSCOPIC PROCEDURE TODAY AT THE [facility]: Refer to the procedure report that was given to you for any specific questions about what was found during the examination.  If the procedure report does not answer your questions, please call your gastroenterologist to clarify.  If you requested that your care partner not be given the details of your procedure findings, then the procedure report has been included in a sealed envelope for you to review at your convenience later.  YOU SHOULD EXPECT: Some feelings of bloating in the abdomen. Passage of more gas than usual.  Walking can help get rid of the air that was put into your GI tract during the procedure and reduce the bloating. If you had a lower endoscopy (such as a colonoscopy or flexible sigmoidoscopy) you may notice spotting of blood in your stool or on the toilet paper. If you underwent a bowel prep for your procedure, then you may not have a normal bowel movement for a few days.  DIET: Your first meal following the procedure should be a light meal and then it is ok to progress to your normal diet.  A half-sandwich or bowl of soup is an example of a good first meal.  Heavy or fried foods are harder to digest and may make you feel nauseous or bloated.  Likewise meals heavy in dairy and vegetables can cause extra gas to form and this can also increase the bloating.  Drink plenty of fluids but you should avoid alcoholic beverages for 24 hours.  ACTIVITY: Your care partner should take you home directly after the procedure.  You should plan to take it easy, moving slowly for the rest of the day.  You can resume normal activity the day after the procedure however you should NOT DRIVE or use heavy machinery for 24 hours (because of the sedation medicines used during the test).    SYMPTOMS TO REPORT IMMEDIATELY: A gastroenterologist can be reached at any hour.  During normal business hours, 8:30 AM to 5:00 PM Monday through Friday,  call [phone].  After hours and on weekends, please call the GI answering service at [phone] who will take a message and have the physician on call contact you.   Following lower endoscopy (colonoscopy or flexible sigmoidoscopy):  Excessive amounts of blood in the stool  Significant tenderness or worsening of abdominal pains  Swelling of the abdomen that is new, acute  Fever of 100F or higher   FOLLOW UP: If any biopsies were taken you will be contacted by phone or by letter within the next 1-3 weeks.  Call your gastroenterologist if you have not heard about the biopsies in 3 weeks.  Our staff will call the home number listed on your records the next business day following your procedure to check on you and address any questions or concerns that you may have at that time regarding the information given to you following your procedure. This is a courtesy call and so if there is no answer at the home number and we have not heard from you through the emergency physician on call, we will assume that you have returned to your regular daily activities without incident.  SIGNATURES/CONFIDENTIALITY: You and/or your care partner have signed paperwork which will be entered into your electronic medical record.  These signatures attest to the fact that that the information above on your After Visit Summary has been reviewed and is understood.  Full responsibility of the confidentiality of   this discharge information lies with you and/or your care-partner.   Information on polyps,diverticulosis,hemorrhoids,& high fiber diet given to you today   

## 2012-01-04 NOTE — Op Note (Signed)
[facility] [address] [place], [address]   COLONOSCOPY PROCEDURE REPORT  PATIENT: [name], [name]  MR#: [id number] BIRTHDATE: 1956-03-16 , 56  yrs. old GENDER: Male ENDOSCOPIST: Rachael Fee, MD PROCEDURE DATE:  01/04/2012 PROCEDURE:   Colonoscopy with snare polypectomy ASA CLASS:   Class III INDICATIONS:adenomatous polyp 2007, september. MEDICATIONS: Fentanyl 50 mcg IV, Versed 6 mg IV, and These medications were titrated to patient response per physician's verbal order  DESCRIPTION OF PROCEDURE:   After the risks benefits and alternatives of the procedure were thoroughly explained, informed consent was obtained.  A digital rectal exam revealed no abnormalities of the rectum.   The LB CF-[id number]  endoscope was introduced through the anus and advanced to the cecum, which was identified by both the appendix and ileocecal valve. No adverse events experienced.   The quality of the prep was good.  The instrument was then slowly withdrawn as the colon was fully examined.   COLON FINDINGS: Two small sessile polyps were found, removed with cold snare and sent to pathology.  These were 3-33mm across, located in cecum and descending segment.  There was mild left sided diverticulosis and small external hemorrhoids.  The examination was otherwise normal.  Retroflexed views revealed no abnormalities. The time to cecum=2 minutes 33 seconds.  Withdrawal time=12 minutes 05 seconds.  The scope was withdrawn and the procedure completed. COMPLICATIONS: There were no complications.  ENDOSCOPIC IMPRESSION: Two small polyps, both were removed and both sent to pathology. There was mild left sided diverticulosis Small external hemorrhoids. The examination was otherwise normal.  RECOMMENDATIONS: Given your personal history of adenomatous (pre-cancerous) polyps, you will need a repeat colonoscopy in 5 years even if the polyps removed today are not precancerous.   You will receive a letter within 1-2 weeks with the results of your biopsy as well as final recommendations.  Please call my office if you have not received a letter after 3 weeks.   eSigned:  Rachael Fee, MD 01/04/2012 2:12 PM    cc: Beverely Low, MD    PATIENT NAME:  Lance Davis, Lance Davis MR#: [id number]

## 2012-01-04 NOTE — Progress Notes (Signed)
Patient did not have preoperative order for IV antibiotic SSI prophylaxis. (G8918)  Patient did not experience any of the following events: a burn prior to discharge; a fall within the facility; wrong site/side/patient/procedure/implant event; or a hospital transfer or hospital admission upon discharge from the facility. (G8907)  

## 2012-01-05 ENCOUNTER — Telehealth: Payer: Self-pay | Admitting: *Deleted

## 2012-01-05 NOTE — Telephone Encounter (Signed)
  Follow up Call-  Call back number 01/04/2012  Post procedure Call Back phone  # [phone]    Permission to leave phone message Yes     Patient questions:  Do you have a fever, pain , or abdominal swelling? no Pain Score  0 *  Have you tolerated food without any problems? yes  Have you been able to return to your normal activities? yes  Do you have any questions about your discharge instructions: Diet   no Medications  no Follow up visit  no  Do you have questions or concerns about your Care? no  Actions: * If pain score is 4 or above: No action needed, pain <4.

## 2012-01-11 ENCOUNTER — Encounter: Payer: Self-pay | Admitting: Gastroenterology

## 2013-10-26 ENCOUNTER — Encounter: Payer: Self-pay | Admitting: Gastroenterology

## 2015-07-21 ENCOUNTER — Other Ambulatory Visit (HOSPITAL_COMMUNITY): Payer: Self-pay | Admitting: Internal Medicine

## 2015-07-21 DIAGNOSIS — I25119 Atherosclerotic heart disease of native coronary artery with unspecified angina pectoris: Secondary | ICD-10-CM

## 2015-07-24 ENCOUNTER — Inpatient Hospital Stay (HOSPITAL_COMMUNITY): Admission: RE | Admit: 2015-07-24 | Payer: 59 | Source: Ambulatory Visit

## 2015-09-18 ENCOUNTER — Ambulatory Visit (HOSPITAL_COMMUNITY)
Admission: RE | Admit: 2015-09-18 | Discharge: 2015-09-18 | Disposition: A | Discharge status: Home / Self Care | Payer: 59 | Source: Ambulatory Visit | Attending: Internal Medicine | Admitting: Internal Medicine

## 2015-09-18 DIAGNOSIS — I25119 Atherosclerotic heart disease of native coronary artery with unspecified angina pectoris: Secondary | ICD-10-CM

## 2016-04-26 ENCOUNTER — Other Ambulatory Visit: Payer: Self-pay | Admitting: Dermatology

## 2016-04-26 DIAGNOSIS — D229 Melanocytic nevi, unspecified: Secondary | ICD-10-CM

## 2016-04-26 DIAGNOSIS — D049 Carcinoma in situ of skin, unspecified: Secondary | ICD-10-CM

## 2016-04-26 DIAGNOSIS — D492 Neoplasm of unspecified behavior of bone, soft tissue, and skin: Secondary | ICD-10-CM | POA: Diagnosis not present

## 2016-04-26 DIAGNOSIS — D0439 Carcinoma in situ of skin of other parts of face: Secondary | ICD-10-CM | POA: Diagnosis not present

## 2016-04-26 DIAGNOSIS — L219 Seborrheic dermatitis, unspecified: Secondary | ICD-10-CM | POA: Diagnosis not present

## 2016-04-26 HISTORY — DX: Melanocytic nevi, unspecified: D22.9

## 2016-04-26 HISTORY — DX: Carcinoma in situ of skin, unspecified: D04.9

## 2016-05-27 DIAGNOSIS — D0439 Carcinoma in situ of skin of other parts of face: Secondary | ICD-10-CM | POA: Diagnosis not present

## 2016-06-28 DIAGNOSIS — I10 Essential (primary) hypertension: Secondary | ICD-10-CM | POA: Diagnosis not present

## 2016-09-30 DIAGNOSIS — I10 Essential (primary) hypertension: Secondary | ICD-10-CM | POA: Diagnosis not present

## 2016-12-24 DIAGNOSIS — R1030 Lower abdominal pain, unspecified: Secondary | ICD-10-CM | POA: Diagnosis not present

## 2016-12-24 DIAGNOSIS — I10 Essential (primary) hypertension: Secondary | ICD-10-CM | POA: Diagnosis not present

## 2017-01-13 ENCOUNTER — Ambulatory Visit
Admission: RE | Admit: 2017-01-13 | Discharge: 2017-01-13 | Disposition: A | Discharge status: Home / Self Care | Payer: 59 | Source: Ambulatory Visit | Attending: Sports Medicine | Admitting: Sports Medicine

## 2017-01-13 ENCOUNTER — Ambulatory Visit (INDEPENDENT_AMBULATORY_CARE_PROVIDER_SITE_OTHER): Payer: 59 | Admitting: Sports Medicine

## 2017-01-13 ENCOUNTER — Encounter: Payer: Self-pay | Admitting: Sports Medicine

## 2017-01-13 VITALS — BP 146/96 | Ht 75.0 in | Wt 260.0 lb

## 2017-01-13 DIAGNOSIS — R1031 Right lower quadrant pain: Secondary | ICD-10-CM | POA: Insufficient documentation

## 2017-01-13 DIAGNOSIS — M1611 Unilateral primary osteoarthritis, right hip: Secondary | ICD-10-CM | POA: Diagnosis not present

## 2017-01-13 MED ORDER — TRAMADOL HCL 50 MG PO TABS: 50.0000 mg | ORAL_TABLET | Freq: Two times a day (BID) | ORAL | 3 refills | Status: DC

## 2017-01-13 NOTE — Progress Notes (Signed)
HPI  CC: Right hip pain Patient presenting with acute exacerbation of right hip pain. He states that he is been experiencing some soreness and discomfort in the right hip for over a year but a few weeks back he had an acute exacerbation after a long weekend in [place]. Pain is located within the right groin with projection into the posterior lateral aspect of the hip and some of the anterior thigh. He denies any acute injury, trauma, or falls. He denies any numbness, weakness, or paresthesias. No bowel or bladder incontinence.  Traumatic: No  Location: Right groin and posterior lateral aspect of the hip  Quality: Aching and stiff  Duration: >31yr, exacerbation ~36month  Timing: Worse at the end of the day and nighttime  Improving/Worsening: Stable/worse  Makes better: Rest  Makes worse: Prolonged exercise  Associated symptoms: None   Previous Interventions Tried: Advil and Tylenol   Past Injuries: Noncontributory  Past Surgeries: finger tendon repair Smoking: none  Family Hx: Noncontributory   ROS: Per HPI; in addition no fever, no rash, no additional weakness, no additional numbness, no additional paresthesias, and no additional falls/injury.   Objective: BP (!) 146/96   Ht 6\' 3"  (1.905 m)   Wt 260 lb (117.9 kg)   BMI 32.50 kg/m  Gen: Right-Hand Dominant. NAD, well groomed, a/o x3, normal affect.  CV: Well-perfused. Warm.  Resp: Non-labored.  Neuro: Sensation intact throughout. No gross coordination deficits.  Gait: Nonpathologic posture, unremarkable stride without signs of limp or balance issues. Hip, Right: TTP noted at the posteriolateral aspect of the greater trochanter near the insertion of the piriformis. Groin pain unable to be reproduced w/ palpation. No obvious rash, erythema, ecchymosis, or edema. ROM full limited only in IR to about 45; Strength 5/5 in IR/ER/Flex/Ext/Abd/Add. Pelvic alignment unremarkable to inspection and palpation. Standing hip rotation and  gait without trendelenburg / unsteadiness. Greater trochanter without tenderness to palpation. No SI joint tenderness and normal minimal SI movement.   Assessment and Plan:  Groin pain, right Patient presenting with gradually worsening right hip pain specifically located over the groin that was difficult to reproduce with palpation. Etiology likely primary osteoarthritic changes to the femoral acetabular joint. Tenderness over the insertional point of the piriformis may represent some mild tendinitis secondary to compensating actions from the groin pain. - Right hip radiographs ordered. - RICE therapy as needed - Tramadol 50 mg twice a day when necessary (initiated today) - Follow-up 4 weeks.  Next: Consideration for intra-articular steroid injection if patient does not have significant improvement at follow-up.   Orders Placed This Encounter  Procedures  . DG HIP UNILAT WITH PELVIS 2-3 VIEWS RIGHT    Standing Status:   Future    Standing Expiration Date:   03/15/2018    Order Specific Question:   Reason for Exam (SYMPTOM  OR DIAGNOSIS REQUIRED)    Answer:   right hip pain    Order Specific Question:   Preferred imaging location?    Answer:   [facility]    Order Specific Question:   Radiology Contrast Protocol - do NOT remove file path    Answer:   [email or web address]    Meds ordered this encounter  Medications  . traMADol (ULTRAM) 50 MG tablet    Sig: Take 1 tablet (50 mg total) by mouth 2 (two) times daily.    Dispense:  60 tablet    Refill:  [place], MD,MS [place] Fellow  01/13/2017 12:37 PM

## 2017-01-13 NOTE — Assessment & Plan Note (Signed)
Patient presenting with gradually worsening right hip pain specifically located over the groin that was difficult to reproduce with palpation. Etiology likely primary osteoarthritic changes to the femoral acetabular joint. Tenderness over the insertional point of the piriformis may represent some mild tendinitis secondary to compensating actions from the groin pain. - Right hip radiographs ordered. - RICE therapy as needed - Tramadol 50 mg twice a day when necessary (initiated today) - Follow-up 4 weeks.  Next: Consideration for intra-articular steroid injection if patient does not have significant improvement at follow-up.

## 2017-01-25 ENCOUNTER — Other Ambulatory Visit: Payer: Self-pay

## 2017-01-25 MED ORDER — MELOXICAM 15 MG PO TABS: 15.0000 mg | ORAL_TABLET | Freq: Every day | ORAL | 3 refills | Status: DC

## 2017-02-01 ENCOUNTER — Other Ambulatory Visit: Payer: Self-pay | Admitting: *Deleted

## 2017-02-01 MED ORDER — TRAMADOL HCL 50 MG PO TABS: ORAL_TABLET | ORAL | 1 refills | Status: DC

## 2017-02-07 ENCOUNTER — Encounter: Payer: Self-pay | Admitting: Gastroenterology

## 2017-02-10 ENCOUNTER — Encounter: Payer: Self-pay | Admitting: Sports Medicine

## 2017-02-10 ENCOUNTER — Ambulatory Visit (INDEPENDENT_AMBULATORY_CARE_PROVIDER_SITE_OTHER): Payer: 59 | Admitting: Sports Medicine

## 2017-02-10 DIAGNOSIS — M1611 Unilateral primary osteoarthritis, right hip: Secondary | ICD-10-CM | POA: Diagnosis not present

## 2017-02-10 DIAGNOSIS — M169 Osteoarthritis of hip, unspecified: Secondary | ICD-10-CM | POA: Insufficient documentation

## 2017-02-10 NOTE — Progress Notes (Signed)
   HPI  CC: Follow-up right hip pain Patient is presenting today for follow-up regarding his right-sided hip pain.  He states that he feels significantly better since his last visit with [place].  He is no longer having issues sleeping at night.  He continues to take his meloxicam and Ultram as needed.  He is not needed any of the either in at least 4 days.  He endorses some occasional groin pain as well as posterior hip pain but nothing as severe as it had been.  He is able to be as active as he likes.  His only concern at this time is the fact that playing golf seems to exacerbate this pain and he does not want to stop this activity.  He denies any new numbness, weakness, paresthesias, or bowel/bladder incontinence.  Medications/Interventions Tried: Meloxicam, Ultram, rest, home exercises.  See HPI and/or previous note for associated ROS.  Objective: BP (!) 138/95   Ht 6\' 3"  (1.905 m)   Wt 259 lb (117.5 kg)   BMI 32.37 kg/m  Gen: NAD, well groomed, a/o x3, normal affect.  CV: Well-perfused. Warm.  Resp: Non-labored.  Neuro: Sensation intact throughout. No gross coordination deficits.  Gait: Nonpathologic posture, unremarkable stride without signs of limp or balance issues. Hip, Right: Minimal TTP noted at the insertion of the piriformis (improved). Groin pain unable to be reproduced w/ palpation (much improved). No obvious rash, erythema, ecchymosis, or edema. ROM full limited only in IR to about 45; Strength 5/5 in IR/ER/Flex/Ext/Abd/Add. Greater trochanter without tenderness to palpation. No SI joint tenderness and normal minimal SI movement.   Assessment and plan:  Osteoarthritis of hip Patient is presenting for follow-up regarding his right hip pain.  Radiographs revealed sclerotic and cystic changes within the femoral acetabular joint consistent with moderate osteoarthritis. -RICE therapy as needed -Continue Ultram and meloxicam as needed (consider prophylactic dosing of meloxicam  prior to golfing) -Discussed benefit of weight loss. -Continue conservative management -Follow-up as needed.   Elberta Leatherwood, MD,MS [facility] Fellow 02/10/2017 11:21 AM

## 2017-02-10 NOTE — Assessment & Plan Note (Signed)
Patient is presenting for follow-up regarding his right hip pain.  Radiographs revealed sclerotic and cystic changes within the femoral acetabular joint consistent with moderate osteoarthritis. -RICE therapy as needed -Continue Ultram and meloxicam as needed (consider prophylactic dosing of meloxicam prior to golfing) -Discussed benefit of weight loss. -Continue conservative management -Follow-up as needed.

## 2017-03-07 DIAGNOSIS — G4733 Obstructive sleep apnea (adult) (pediatric): Secondary | ICD-10-CM | POA: Diagnosis not present

## 2017-03-14 DIAGNOSIS — G4733 Obstructive sleep apnea (adult) (pediatric): Secondary | ICD-10-CM | POA: Diagnosis not present

## 2017-03-30 DIAGNOSIS — K635 Polyp of colon: Secondary | ICD-10-CM | POA: Diagnosis not present

## 2017-03-30 DIAGNOSIS — Z6841 Body Mass Index (BMI) 40.0 and over, adult: Secondary | ICD-10-CM | POA: Diagnosis not present

## 2017-03-30 DIAGNOSIS — Z1211 Encounter for screening for malignant neoplasm of colon: Secondary | ICD-10-CM | POA: Diagnosis not present

## 2017-03-30 DIAGNOSIS — M1610 Unilateral primary osteoarthritis, unspecified hip: Secondary | ICD-10-CM | POA: Diagnosis not present

## 2017-03-30 DIAGNOSIS — G4733 Obstructive sleep apnea (adult) (pediatric): Secondary | ICD-10-CM | POA: Diagnosis not present

## 2017-03-30 DIAGNOSIS — I10 Essential (primary) hypertension: Secondary | ICD-10-CM | POA: Diagnosis not present

## 2017-04-05 DIAGNOSIS — C801 Malignant (primary) neoplasm, unspecified: Secondary | ICD-10-CM

## 2017-04-05 HISTORY — DX: Malignant (primary) neoplasm, unspecified: C80.1

## 2017-07-14 DIAGNOSIS — T24232A Burn of second degree of left lower leg, initial encounter: Secondary | ICD-10-CM | POA: Diagnosis not present

## 2017-09-13 DIAGNOSIS — G4733 Obstructive sleep apnea (adult) (pediatric): Secondary | ICD-10-CM | POA: Diagnosis not present

## 2017-09-20 DIAGNOSIS — M25559 Pain in unspecified hip: Secondary | ICD-10-CM | POA: Diagnosis not present

## 2017-10-25 DIAGNOSIS — G4733 Obstructive sleep apnea (adult) (pediatric): Secondary | ICD-10-CM | POA: Diagnosis not present

## 2017-10-31 DIAGNOSIS — S61412A Laceration without foreign body of left hand, initial encounter: Secondary | ICD-10-CM | POA: Diagnosis not present

## 2017-11-02 DIAGNOSIS — G4733 Obstructive sleep apnea (adult) (pediatric): Secondary | ICD-10-CM | POA: Diagnosis not present

## 2017-11-25 DIAGNOSIS — G4733 Obstructive sleep apnea (adult) (pediatric): Secondary | ICD-10-CM | POA: Diagnosis not present

## 2017-12-13 ENCOUNTER — Other Ambulatory Visit: Payer: Self-pay | Admitting: Dermatology

## 2017-12-13 DIAGNOSIS — C4330 Malignant melanoma of unspecified part of face: Secondary | ICD-10-CM | POA: Diagnosis not present

## 2017-12-13 DIAGNOSIS — C439 Malignant melanoma of skin, unspecified: Secondary | ICD-10-CM

## 2017-12-13 DIAGNOSIS — M436 Torticollis: Secondary | ICD-10-CM | POA: Diagnosis not present

## 2017-12-13 DIAGNOSIS — C4339 Malignant melanoma of other parts of face: Secondary | ICD-10-CM | POA: Diagnosis not present

## 2017-12-13 HISTORY — DX: Malignant melanoma of skin, unspecified: C43.9

## 2017-12-22 ENCOUNTER — Other Ambulatory Visit: Payer: Self-pay | Admitting: Sports Medicine

## 2017-12-26 DIAGNOSIS — G4733 Obstructive sleep apnea (adult) (pediatric): Secondary | ICD-10-CM | POA: Diagnosis not present

## 2017-12-27 ENCOUNTER — Encounter: Payer: Self-pay | Admitting: Gastroenterology

## 2017-12-27 DIAGNOSIS — I10 Essential (primary) hypertension: Secondary | ICD-10-CM | POA: Diagnosis not present

## 2017-12-27 DIAGNOSIS — Z23 Encounter for immunization: Secondary | ICD-10-CM | POA: Diagnosis not present

## 2017-12-27 DIAGNOSIS — G4730 Sleep apnea, unspecified: Secondary | ICD-10-CM | POA: Diagnosis not present

## 2017-12-27 DIAGNOSIS — K635 Polyp of colon: Secondary | ICD-10-CM | POA: Diagnosis not present

## 2017-12-27 DIAGNOSIS — Z1211 Encounter for screening for malignant neoplasm of colon: Secondary | ICD-10-CM | POA: Diagnosis not present

## 2018-01-09 DIAGNOSIS — C4339 Malignant melanoma of other parts of face: Secondary | ICD-10-CM | POA: Diagnosis not present

## 2018-01-09 DIAGNOSIS — D0339 Melanoma in situ of other parts of face: Secondary | ICD-10-CM | POA: Diagnosis not present

## 2018-01-09 DIAGNOSIS — L988 Other specified disorders of the skin and subcutaneous tissue: Secondary | ICD-10-CM | POA: Diagnosis not present

## 2018-01-09 DIAGNOSIS — L814 Other melanin hyperpigmentation: Secondary | ICD-10-CM | POA: Diagnosis not present

## 2018-01-09 DIAGNOSIS — L578 Other skin changes due to chronic exposure to nonionizing radiation: Secondary | ICD-10-CM | POA: Diagnosis not present

## 2018-01-25 DIAGNOSIS — G4733 Obstructive sleep apnea (adult) (pediatric): Secondary | ICD-10-CM | POA: Diagnosis not present

## 2018-02-02 ENCOUNTER — Ambulatory Visit (AMBULATORY_SURGERY_CENTER): Payer: Self-pay

## 2018-02-02 VITALS — Ht 75.0 in | Wt 263.4 lb

## 2018-02-02 DIAGNOSIS — Z86010 Personal history of colonic polyps: Secondary | ICD-10-CM

## 2018-02-02 MED ORDER — NA SULFATE-K SULFATE-MG SULF 17.5-3.13-1.6 GM/177ML PO SOLN: 1.0000 | Freq: Once | ORAL | 0 refills | Status: AC

## 2018-02-02 NOTE — Progress Notes (Signed)
No egg or soy allergy known to patient  No issues with past sedation with any surgeries  or procedures, no intubation problems  No diet pills per patient No home 02 use per patient  No blood thinners per patient  Pt denies issues with constipation  No A fib or A flutter  EMMI video sent to pt's e mail  Pt. declined 

## 2018-02-03 ENCOUNTER — Encounter: Payer: Self-pay | Admitting: Gastroenterology

## 2018-02-06 DIAGNOSIS — H52223 Regular astigmatism, bilateral: Secondary | ICD-10-CM | POA: Diagnosis not present

## 2018-02-06 DIAGNOSIS — H524 Presbyopia: Secondary | ICD-10-CM | POA: Diagnosis not present

## 2018-02-06 DIAGNOSIS — H2513 Age-related nuclear cataract, bilateral: Secondary | ICD-10-CM | POA: Diagnosis not present

## 2018-02-06 DIAGNOSIS — H5203 Hypermetropia, bilateral: Secondary | ICD-10-CM | POA: Diagnosis not present

## 2018-02-07 DIAGNOSIS — M654 Radial styloid tenosynovitis [de Quervain]: Secondary | ICD-10-CM | POA: Diagnosis not present

## 2018-02-13 ENCOUNTER — Telehealth: Payer: Self-pay | Admitting: Gastroenterology

## 2018-02-13 NOTE — Telephone Encounter (Signed)
Called pt- no answer- left a message on his number that identifies pt by first and last name starting today NO nuts, seeds, corn, beans of any kind, popcorn, salads raw veggies and that he needs to drink A LOT of water / fluids the next few days and that he MUST follow his prep directions to a T. Left number for pt to call with any further questions . He should be ok with scheduled colon Wednesday 11-13  Marie pV

## 2018-02-13 NOTE — Telephone Encounter (Signed)
Patient states he did not read the instructions for his procedure this Wednesday 11.13.19 and did not start the 5 days prior diet till today 11.11.19. Patient wanting to know if the prep will still work. Pt had pv on 10.31.19.

## 2018-02-15 ENCOUNTER — Ambulatory Visit (AMBULATORY_SURGERY_CENTER): Payer: 59 | Admitting: Gastroenterology

## 2018-02-15 ENCOUNTER — Encounter: Payer: Self-pay | Admitting: Gastroenterology

## 2018-02-15 VITALS — BP 114/71 | HR 53 | Temp 95.7°F | Resp 11 | Ht 75.0 in | Wt 263.0 lb

## 2018-02-15 DIAGNOSIS — Z1211 Encounter for screening for malignant neoplasm of colon: Secondary | ICD-10-CM | POA: Diagnosis not present

## 2018-02-15 DIAGNOSIS — Z86010 Personal history of colonic polyps: Secondary | ICD-10-CM

## 2018-02-15 DIAGNOSIS — K5730 Diverticulosis of large intestine without perforation or abscess without bleeding: Secondary | ICD-10-CM

## 2018-02-15 DIAGNOSIS — D125 Benign neoplasm of sigmoid colon: Secondary | ICD-10-CM

## 2018-02-15 DIAGNOSIS — D120 Benign neoplasm of cecum: Secondary | ICD-10-CM | POA: Diagnosis not present

## 2018-02-15 DIAGNOSIS — K635 Polyp of colon: Secondary | ICD-10-CM

## 2018-02-15 MED ORDER — SODIUM CHLORIDE 0.9 % IV SOLN: 500.0000 mL | Freq: Once | INTRAVENOUS | Status: DC

## 2018-02-15 NOTE — Progress Notes (Signed)
Called to room to assist during endoscopic procedure.  Patient ID and intended procedure confirmed with present staff. Received instructions for my participation in the procedure from the performing physician.  

## 2018-02-15 NOTE — Op Note (Signed)
[facility] Patient Name: Lance Davis Procedure Date: 02/15/2018 8:40 AM MRN: [id number] Endoscopist: Milus Banister , MD Age: 62 Referring MD:  Date of Birth: 1956-03-01 Gender: Male Account #: [id number] Procedure:                Colonoscopy Indications:              High risk colon cancer surveillance: Personal                            history of colonic polyps: Colonoscopy 2013 Dr.                            Ardis Hughs found two subCM polyps, one was adenomatous Medicines:                Monitored Anesthesia Care Procedure:                Pre-Anesthesia Assessment:                           - Prior to the procedure, a History and Physical                            was performed, and patient medications and                            allergies were reviewed. The patient's tolerance of                            previous anesthesia was also reviewed. The risks                            and benefits of the procedure and the sedation                            options and risks were discussed with the patient.                            All questions were answered, and informed consent                            was obtained. Prior Anticoagulants: The patient has                            taken no previous anticoagulant or antiplatelet                            agents. ASA Grade Assessment: II - A patient with                            mild systemic disease. After reviewing the risks                            and benefits, the patient was deemed in  satisfactory condition to undergo the procedure.                           After obtaining informed consent, the colonoscope                            was passed under direct vision. Throughout the                            procedure, the patient's blood pressure, pulse, and                            oxygen saturations were monitored continuously. The                            Model  CF-HQ190L [id number]) scope was introduced                            through the anus and advanced to the the cecum,                            identified by appendiceal orifice and ileocecal                            valve. The colonoscopy was performed without                            difficulty. The patient tolerated the procedure                            well. The quality of the bowel preparation was good. Scope In: 8:49:39 AM Scope Out: 9:02:24 AM Scope Withdrawal Time: 0 hours 9 minutes 29 seconds  Total Procedure Duration: 0 hours 12 minutes 45 seconds  Findings:                 Two sessile polyps were found in the sigmoid colon                            and cecum. The polyps were 3 to 4 mm in size. These                            polyps were removed with a cold snare. Resection                            and retrieval were complete.                           Multiple small and large-mouthed diverticula were                            found in the left colon.                           The exam was otherwise without abnormality on  direct and retroflexion views. Complications:            No immediate complications. Estimated blood loss:                            None. Estimated Blood Loss:     Estimated blood loss: none. Impression:               - Two 3 to 4 mm polyps in the sigmoid colon and in                            the cecum, removed with a cold snare. Resected and                            retrieved.                           - Diverticulosis in the left colon.                           - The examination was otherwise normal on direct                            and retroflexion views. Recommendation:           - Patient has a contact number available for                            emergencies. The signs and symptoms of potential                            delayed complications were discussed with the                            patient.  Return to normal activities tomorrow.                            Written discharge instructions were provided to the                            patient.                           - Resume previous diet.                           - Continue present medications.                           You will receive a letter within 2-3 weeks with the                            pathology results and my final recommendations.                           If the polyp(s) is proven to be 'pre-cancerous' on  pathology, you will need repeat colonoscopy in 5                            years. If the polyp(s) is NOT 'precancerous' on                            pathology then you should repeat colon cancer                            screening in 10 years with colonoscopy without need                            for colon cancer screening by any method prior to                            then (including stool testing). Milus Banister, MD 02/15/2018 9:06:37 AM This report has been signed electronically.

## 2018-02-15 NOTE — Patient Instructions (Signed)
Handouts provided:  Polyps and Diverticulosis  YOU HAD AN ENDOSCOPIC PROCEDURE TODAY AT [facility]:   Refer to the procedure report that was given to you for any specific questions about what was found during the examination.  If the procedure report does not answer your questions, please call your gastroenterologist to clarify.  If you requested that your care partner not be given the details of your procedure findings, then the procedure report has been included in a sealed envelope for you to review at your convenience later.  YOU SHOULD EXPECT: Some feelings of bloating in the abdomen. Passage of more gas than usual.  Walking can help get rid of the air that was put into your GI tract during the procedure and reduce the bloating. If you had a lower endoscopy (such as a colonoscopy or flexible sigmoidoscopy) you may notice spotting of blood in your stool or on the toilet paper. If you underwent a bowel prep for your procedure, you may not have a normal bowel movement for a few days.  Please Note:  You might notice some irritation and congestion in your nose or some drainage.  This is from the oxygen used during your procedure.  There is no need for concern and it should clear up in a day or so.  SYMPTOMS TO REPORT IMMEDIATELY:   Following lower endoscopy (colonoscopy or flexible sigmoidoscopy):  Excessive amounts of blood in the stool  Significant tenderness or worsening of abdominal pains  Swelling of the abdomen that is new, acute  Fever of 100F or higher  For urgent or emergent issues, a gastroenterologist can be reached at any hour by calling [phone].   DIET:  We do recommend a small meal at first, but then you may proceed to your regular diet.  Drink plenty of fluids but you should avoid alcoholic beverages for 24 hours.  ACTIVITY:  You should plan to take it easy for the rest of today and you should NOT DRIVE or use heavy machinery until tomorrow (because of  the sedation medicines used during the test).    FOLLOW UP: Our staff will call the number listed on your records the next business day following your procedure to check on you and address any questions or concerns that you may have regarding the information given to you following your procedure. If we do not reach you, we will leave a message.  However, if you are feeling well and you are not experiencing any problems, there is no need to return our call.  We will assume that you have returned to your regular daily activities without incident.  If any biopsies were taken you will be contacted by phone or by letter within the next 1-3 weeks.  Please call us at [phone] if you have not heard about the biopsies in 3 weeks.    SIGNATURES/CONFIDENTIALITY: You and/or your care partner have signed paperwork which will be entered into your electronic medical record.  These signatures attest to the fact that that the information above on your After Visit Summary has been reviewed and is understood.  Full responsibility of the confidentiality of this discharge information lies with you and/or your care-partner.

## 2018-02-15 NOTE — Progress Notes (Signed)
To PACU, VSS. Report to [email or web address] 

## 2018-02-16 ENCOUNTER — Telehealth: Payer: Self-pay | Admitting: *Deleted

## 2018-02-16 NOTE — Telephone Encounter (Signed)
  Follow up Call-  Call back number 02/15/2018  Post procedure Call Back phone  # [phone]  Permission to leave phone message Yes  Some recent data might be hidden     Patient questions:  Do you have a fever, pain , or abdominal swelling? No. Pain Score  0 *  Have you tolerated food without any problems? Yes.    Have you been able to return to your normal activities? Yes.    Do you have any questions about your discharge instructions: Diet   No. Medications  No. Follow up visit  No.  Do you have questions or concerns about your Care? No.  Actions: * If pain score is 4 or above: No action needed, pain <4.

## 2018-02-20 ENCOUNTER — Encounter: Payer: Self-pay | Admitting: Gastroenterology

## 2018-05-23 DIAGNOSIS — M6740 Ganglion, unspecified site: Secondary | ICD-10-CM | POA: Diagnosis not present

## 2018-05-23 DIAGNOSIS — Z85820 Personal history of malignant melanoma of skin: Secondary | ICD-10-CM | POA: Diagnosis not present

## 2018-05-23 DIAGNOSIS — D229 Melanocytic nevi, unspecified: Secondary | ICD-10-CM | POA: Diagnosis not present

## 2018-07-19 ENCOUNTER — Other Ambulatory Visit: Payer: Self-pay | Admitting: Sports Medicine

## 2018-07-25 ENCOUNTER — Other Ambulatory Visit: Payer: Self-pay | Admitting: Sports Medicine

## 2018-07-25 DIAGNOSIS — G4733 Obstructive sleep apnea (adult) (pediatric): Secondary | ICD-10-CM | POA: Diagnosis not present

## 2018-07-25 MED ORDER — TRAMADOL HCL 50 MG PO TABS: ORAL_TABLET | ORAL | 1 refills | Status: DC

## 2018-07-25 NOTE — Addendum Note (Signed)
Addended by: Stefanie Libel on: 07/25/2018 12:36 PM   Modules accepted: Orders

## 2018-07-26 ENCOUNTER — Other Ambulatory Visit: Payer: Self-pay | Admitting: Sports Medicine

## 2018-07-31 ENCOUNTER — Telehealth: Payer: Self-pay | Admitting: *Deleted

## 2018-07-31 MED ORDER — TRAMADOL HCL 50 MG PO TABS: ORAL_TABLET | ORAL | 1 refills | Status: DC

## 2018-07-31 NOTE — Telephone Encounter (Signed)
Pt had Rx for tramadol sent to [facility] pharmacy. Needs to be resubmitted to [facility]. Dr. Oneida Alar original prescribing physician. He is unable to represcribe while out of office. I will resubmit on his behalf to the appropriate pharmacy

## 2018-08-01 ENCOUNTER — Other Ambulatory Visit: Payer: Self-pay | Admitting: Sports Medicine

## 2018-08-01 MED ORDER — TRAMADOL HCL 50 MG PO TABS: ORAL_TABLET | ORAL | 1 refills | Status: DC

## 2018-09-05 DIAGNOSIS — C439 Malignant melanoma of skin, unspecified: Secondary | ICD-10-CM | POA: Diagnosis not present

## 2018-09-05 DIAGNOSIS — I10 Essential (primary) hypertension: Secondary | ICD-10-CM | POA: Diagnosis not present

## 2018-09-05 DIAGNOSIS — K635 Polyp of colon: Secondary | ICD-10-CM | POA: Diagnosis not present

## 2018-10-16 DIAGNOSIS — R319 Hematuria, unspecified: Secondary | ICD-10-CM | POA: Diagnosis not present

## 2018-10-19 DIAGNOSIS — R310 Gross hematuria: Secondary | ICD-10-CM | POA: Diagnosis not present

## 2018-10-26 DIAGNOSIS — K5730 Diverticulosis of large intestine without perforation or abscess without bleeding: Secondary | ICD-10-CM | POA: Diagnosis not present

## 2018-10-26 DIAGNOSIS — R310 Gross hematuria: Secondary | ICD-10-CM | POA: Diagnosis not present

## 2018-10-26 DIAGNOSIS — I700 Atherosclerosis of aorta: Secondary | ICD-10-CM | POA: Diagnosis not present

## 2018-10-31 DIAGNOSIS — R3121 Asymptomatic microscopic hematuria: Secondary | ICD-10-CM | POA: Diagnosis not present

## 2018-11-03 DIAGNOSIS — R319 Hematuria, unspecified: Secondary | ICD-10-CM | POA: Diagnosis not present

## 2019-01-02 DIAGNOSIS — K635 Polyp of colon: Secondary | ICD-10-CM | POA: Diagnosis not present

## 2019-01-02 DIAGNOSIS — F909 Attention-deficit hyperactivity disorder, unspecified type: Secondary | ICD-10-CM | POA: Diagnosis not present

## 2019-01-02 DIAGNOSIS — Z23 Encounter for immunization: Secondary | ICD-10-CM | POA: Diagnosis not present

## 2019-01-02 DIAGNOSIS — C4330 Malignant melanoma of unspecified part of face: Secondary | ICD-10-CM | POA: Diagnosis not present

## 2019-01-02 DIAGNOSIS — I10 Essential (primary) hypertension: Secondary | ICD-10-CM | POA: Diagnosis not present

## 2019-01-02 DIAGNOSIS — Z008 Encounter for other general examination: Secondary | ICD-10-CM | POA: Diagnosis not present

## 2019-01-24 DIAGNOSIS — L570 Actinic keratosis: Secondary | ICD-10-CM | POA: Diagnosis not present

## 2019-01-24 DIAGNOSIS — Z23 Encounter for immunization: Secondary | ICD-10-CM | POA: Diagnosis not present

## 2019-01-24 DIAGNOSIS — L820 Inflamed seborrheic keratosis: Secondary | ICD-10-CM | POA: Diagnosis not present

## 2019-01-24 DIAGNOSIS — L821 Other seborrheic keratosis: Secondary | ICD-10-CM | POA: Diagnosis not present

## 2019-01-24 DIAGNOSIS — D485 Neoplasm of uncertain behavior of skin: Secondary | ICD-10-CM | POA: Diagnosis not present

## 2019-01-24 DIAGNOSIS — Z85820 Personal history of malignant melanoma of skin: Secondary | ICD-10-CM | POA: Diagnosis not present

## 2019-02-01 DIAGNOSIS — I10 Essential (primary) hypertension: Secondary | ICD-10-CM | POA: Diagnosis not present

## 2019-03-09 ENCOUNTER — Other Ambulatory Visit: Payer: Self-pay

## 2019-03-09 DIAGNOSIS — Z20822 Contact with and (suspected) exposure to covid-19: Secondary | ICD-10-CM

## 2019-03-10 LAB — NOVEL CORONAVIRUS, NAA: SARS-CoV-2, NAA: NOT DETECTED

## 2019-03-13 DIAGNOSIS — I10 Essential (primary) hypertension: Secondary | ICD-10-CM | POA: Diagnosis not present

## 2019-03-29 DIAGNOSIS — G4733 Obstructive sleep apnea (adult) (pediatric): Secondary | ICD-10-CM | POA: Diagnosis not present

## 2019-04-16 DIAGNOSIS — I10 Essential (primary) hypertension: Secondary | ICD-10-CM | POA: Diagnosis not present

## 2019-05-07 DIAGNOSIS — H52223 Regular astigmatism, bilateral: Secondary | ICD-10-CM | POA: Diagnosis not present

## 2019-05-07 DIAGNOSIS — H43393 Other vitreous opacities, bilateral: Secondary | ICD-10-CM | POA: Diagnosis not present

## 2019-05-07 DIAGNOSIS — H53002 Unspecified amblyopia, left eye: Secondary | ICD-10-CM | POA: Diagnosis not present

## 2019-05-07 DIAGNOSIS — H11441 Conjunctival cysts, right eye: Secondary | ICD-10-CM | POA: Diagnosis not present

## 2019-05-07 DIAGNOSIS — H43813 Vitreous degeneration, bilateral: Secondary | ICD-10-CM | POA: Diagnosis not present

## 2019-05-07 DIAGNOSIS — H5203 Hypermetropia, bilateral: Secondary | ICD-10-CM | POA: Diagnosis not present

## 2019-05-07 DIAGNOSIS — H11449 Conjunctival cysts, unspecified eye: Secondary | ICD-10-CM | POA: Diagnosis not present

## 2019-05-07 DIAGNOSIS — H524 Presbyopia: Secondary | ICD-10-CM | POA: Diagnosis not present

## 2019-05-07 DIAGNOSIS — H04123 Dry eye syndrome of bilateral lacrimal glands: Secondary | ICD-10-CM | POA: Diagnosis not present

## 2019-05-10 DIAGNOSIS — D485 Neoplasm of uncertain behavior of skin: Secondary | ICD-10-CM | POA: Diagnosis not present

## 2019-05-10 DIAGNOSIS — L821 Other seborrheic keratosis: Secondary | ICD-10-CM | POA: Diagnosis not present

## 2019-05-10 DIAGNOSIS — D1801 Hemangioma of skin and subcutaneous tissue: Secondary | ICD-10-CM | POA: Diagnosis not present

## 2019-05-10 DIAGNOSIS — L578 Other skin changes due to chronic exposure to nonionizing radiation: Secondary | ICD-10-CM | POA: Diagnosis not present

## 2019-05-10 DIAGNOSIS — L814 Other melanin hyperpigmentation: Secondary | ICD-10-CM | POA: Diagnosis not present

## 2019-05-10 DIAGNOSIS — D2262 Melanocytic nevi of left upper limb, including shoulder: Secondary | ICD-10-CM | POA: Diagnosis not present

## 2019-05-10 DIAGNOSIS — L570 Actinic keratosis: Secondary | ICD-10-CM | POA: Diagnosis not present

## 2019-05-16 IMAGING — CR DG HIP (WITH OR WITHOUT PELVIS) 2-3V*R*
2 series · 2 of 2 positions shown · non-contrast
Comparison: None.

CLINICAL DATA: 61-year-old male. Right hip pain and groin pain for
the past month. Pain with walking. No injury. Initial encounter.

EXAM:
DG HIP (WITH OR WITHOUT PELVIS) 2-3V RIGHT

[t hip ap right]
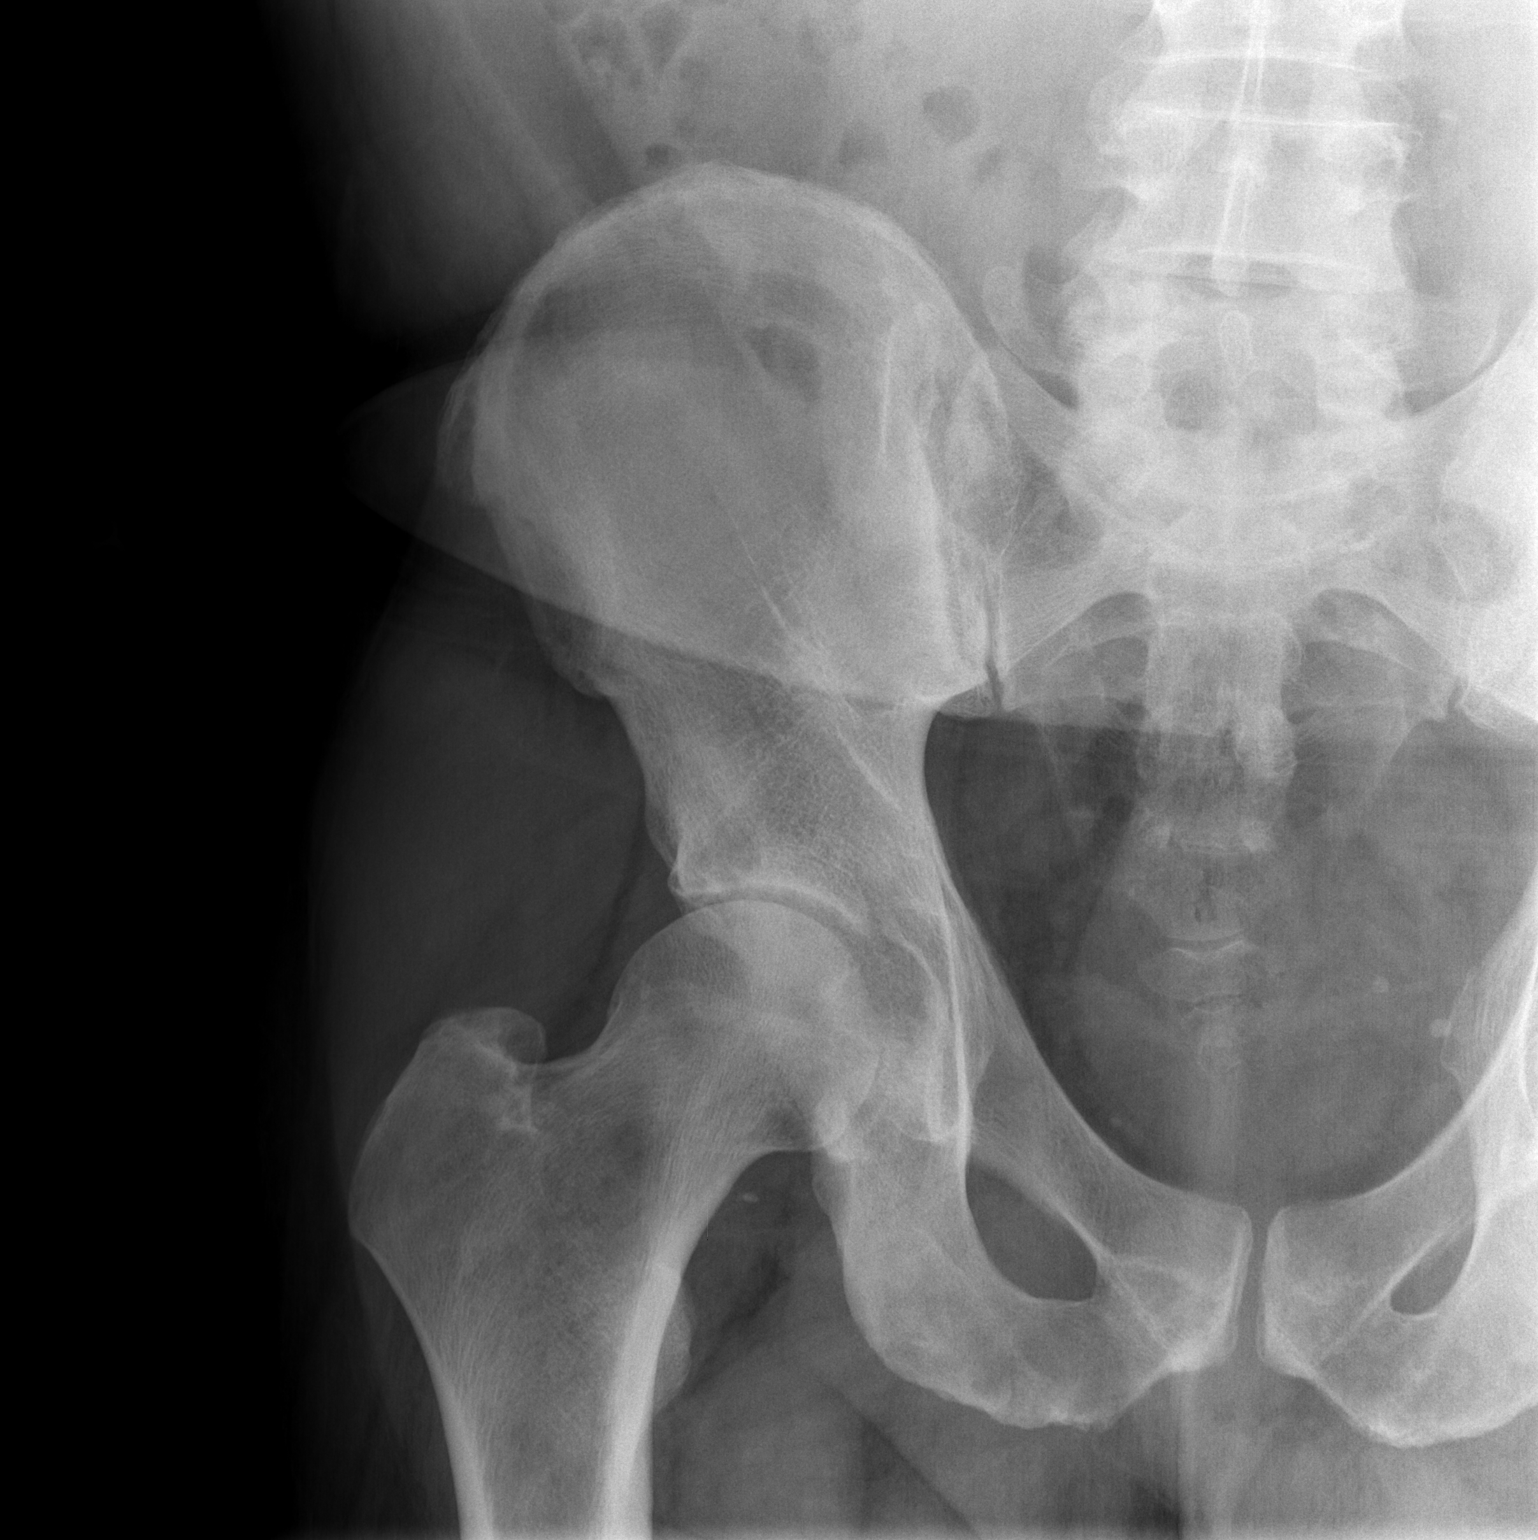

[t hip frog leg right]
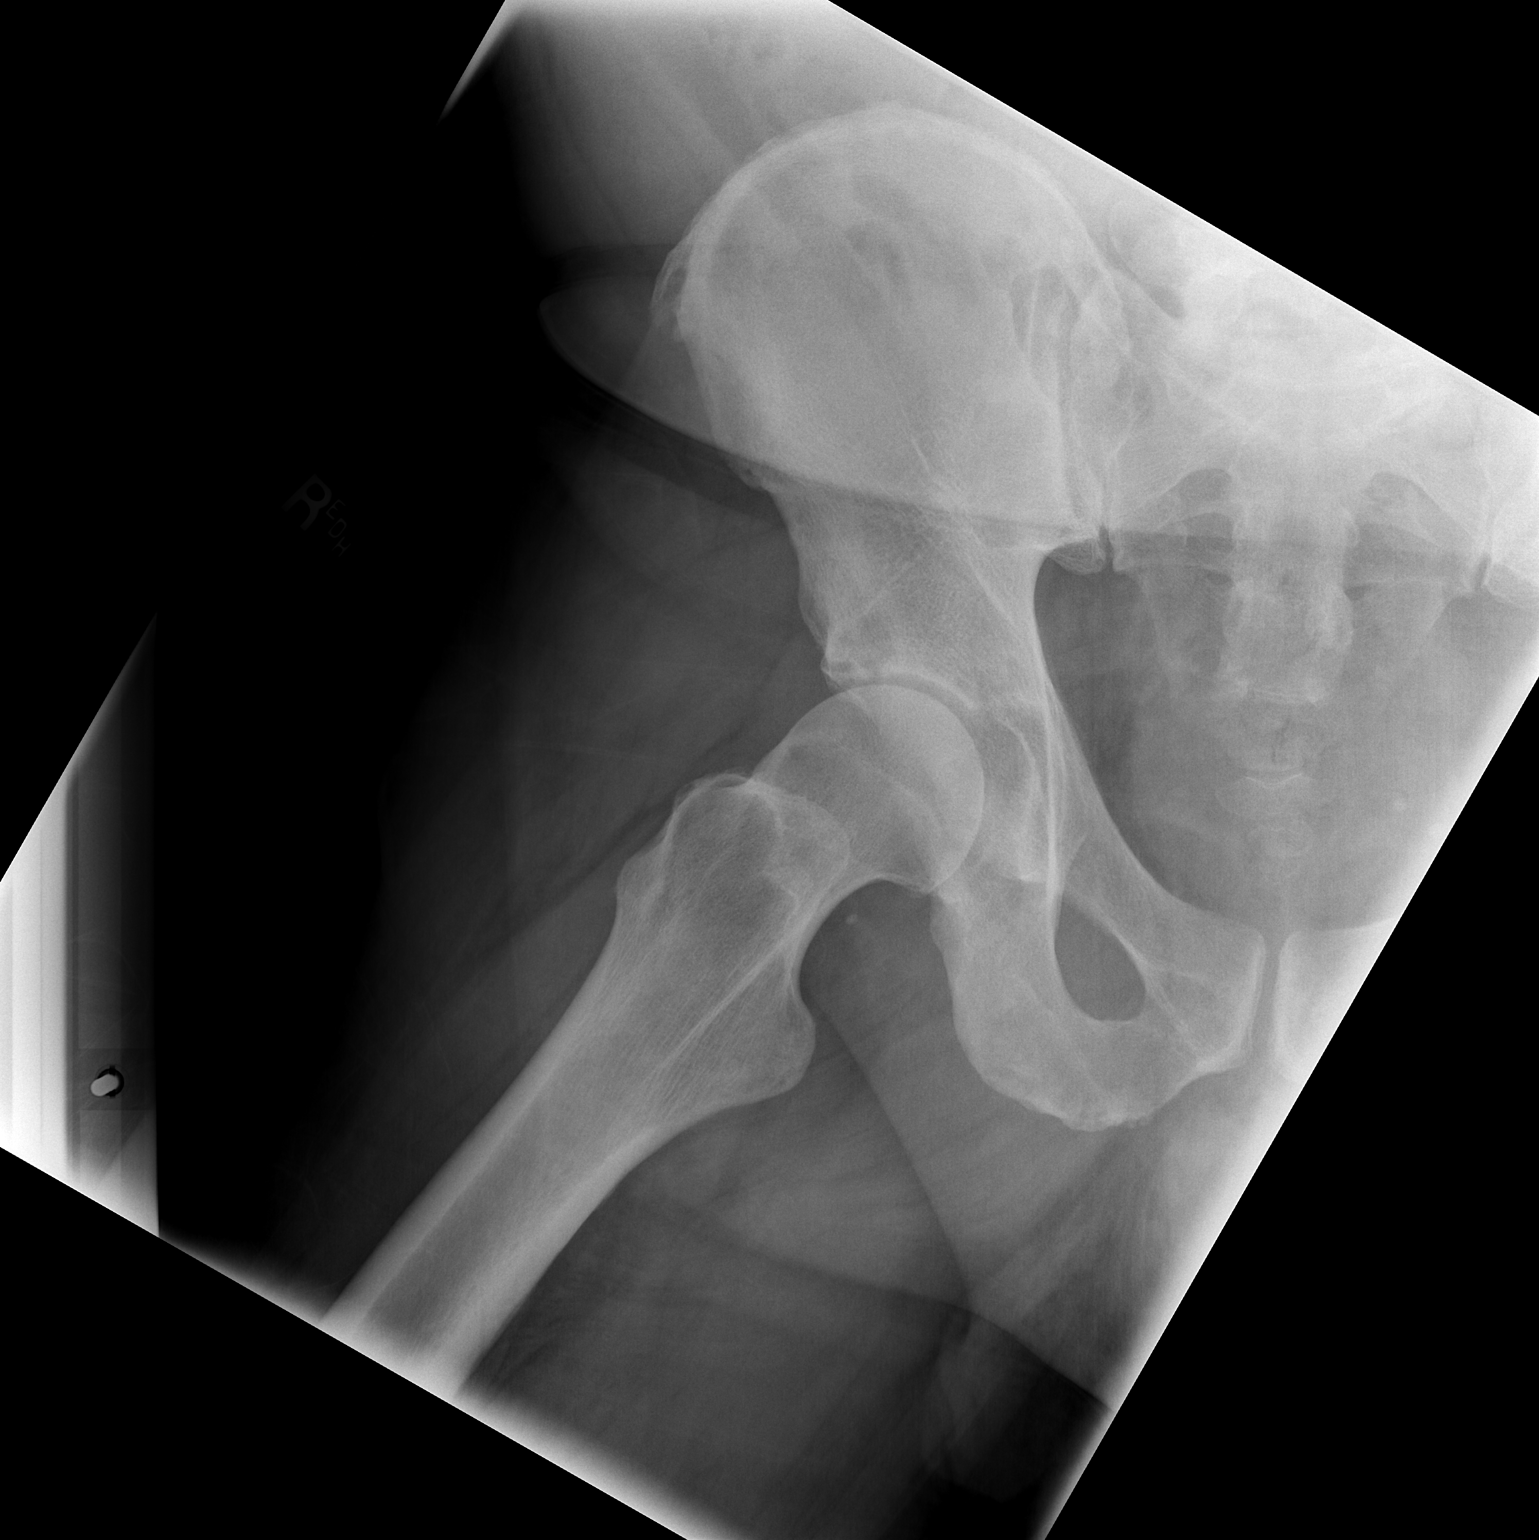

[2 of 2 positions shown; findings below may reference images not displayed]

FINDINGS: Mild narrowing superior right hip joint with subchondral cystic
changes of adjacent acetabulum.

No plain film evidence of femoral head avascular necrosis.

No fracture or dislocation.
IMPRESSION: Mild narrowing superior right hip joint with subchondral cystic
changes of adjacent acetabulum.

## 2019-05-17 ENCOUNTER — Encounter (HOSPITAL_COMMUNITY): Payer: Self-pay

## 2019-05-17 ENCOUNTER — Other Ambulatory Visit: Payer: Self-pay

## 2019-05-17 ENCOUNTER — Ambulatory Visit (HOSPITAL_COMMUNITY)
Admission: EM | Admit: 2019-05-17 | Discharge: 2019-05-17 | Disposition: A | Discharge status: Home / Self Care | Payer: 59 | Attending: Family Medicine | Admitting: Family Medicine

## 2019-05-17 DIAGNOSIS — S61212A Laceration without foreign body of right middle finger without damage to nail, initial encounter: Secondary | ICD-10-CM | POA: Diagnosis not present

## 2019-05-17 NOTE — ED Provider Notes (Signed)
[place]    CSN: [id number] Arrival date & time: 05/17/19  1641      History   Chief Complaint Chief Complaint  Patient presents with  . Laceration    HPI Lance Davis is a 64 y.o. male.   HPI    Cut finger on a piece of plastic while trying to open a plastic jar Has laceration on his knuckle that needs repair States is in good health Tetanus is up to date   Past Medical History:  Diagnosis Date  . Allergy    spring time  . Arthritis   . Atypical nevus 04/26/2016   mild - right mid back  . Blood transfusion 1963  . Cancer ([place]) 2019   melanoma face  . Clark level II melanoma ([place]) 12/13/2017   left inner cheek  . Sleep apnea    cpap  . Squamous cell carcinoma in situ of skin 04/26/2016   inner left cheek    Patient Active Problem List   Diagnosis Date Noted  . Osteoarthritis of hip 02/10/2017  . Groin pain, right 01/13/2017    Past Surgical History:  Procedure Laterality Date  . COLONOSCOPY    . I & D EXTREMITY  07/14/2011   Procedure: IRRIGATION AND DEBRIDEMENT EXTREMITY;  Surgeon: Linna Hoff, MD;  Location: [facility];  Service: Orthopedics;  Laterality: Left;  . POLYPECTOMY    . TENDON REPAIR  07/14/2011   Procedure: TENDON REPAIR;  Surgeon: Linna Hoff, MD;  Location: [facility];  Service: Orthopedics;  Laterality: Left;       Home Medications    Prior to Admission medications   Medication Sig Start Date End Date Taking? Authorizing Provider  aspirin EC 81 MG tablet Take 81 mg by mouth daily.   Yes [provider]  losartan (COZAAR) 100 MG tablet Take 100 mg by mouth daily.   Yes [provider]  tadalafil (CIALIS) 5 MG tablet Take 5 mg by mouth daily as needed for erectile dysfunction.    [provider]  traMADol Veatrice Bourbon) 50 MG tablet Take 2 tablets by mouth three times daily as needed for pain 08/01/18 05/17/19  Stefanie Libel, MD    Family History Family History  Problem Relation Age of Onset  .  Diabetes Mother   . Heart disease Mother   . Diabetes Sister   . Diabetes Brother   . Colon cancer Neg Hx   . Colon polyps Neg Hx   . Esophageal cancer Neg Hx   . Stomach cancer Neg Hx   . Rectal cancer Neg Hx     Social History Social History   Tobacco Use  . Smoking status: Never Smoker  . Smokeless tobacco: Never Used  Substance Use Topics  . Alcohol use: Yes    Alcohol/week: 14.0 standard drinks    Types: 14 Glasses of wine per week  . Drug use: No     Allergies   Patient has no known allergies.   Review of Systems Review of Systems  Skin: Positive for wound.     Physical Exam Triage Vital Signs ED Triage Vitals  Enc Vitals Group     BP 05/17/19 1657 (!) 153/72     Pulse Rate 05/17/19 1657 60     Resp 05/17/19 1657 20     Temp 05/17/19 1657 97.9 F (36.6 C)     Temp Source 05/17/19 1657 Oral     SpO2 05/17/19 1657 98 %  Weight --      Height --      Head Circumference --      Peak Flow --      Pain Score 05/17/19 1654 2     Pain Loc --      Pain Edu? --      Excl. in [place]? --    No data found.  Updated Vital Signs BP (!) 153/72 (BP Location: Left Arm)   Pulse 60   Temp 97.9 F (36.6 C) (Oral)   Resp 20   SpO2 98%      Physical Exam Constitutional:      General: He is not in acute distress.    Appearance: Normal appearance. He is normal weight.  HENT:     Mouth/Throat:     Comments: Mask in place Eyes:     Pupils: Pupils are equal, round, and reactive to light.  Cardiovascular:     Rate and Rhythm: Normal rate and regular rhythm.  Pulmonary:     Effort: Pulmonary effort is normal. No respiratory distress.  Musculoskeletal:     Cervical back: Normal range of motion.  Skin:    Comments: "C" shaped laceration over the PIP joint of the long finger right hand.  No tendon involvement. Full thickness  Neurological:     General: No focal deficit present.     Mental Status: He is alert.     Sensory: No sensory deficit.  Psychiatric:         Mood and Affect: Mood normal.        Behavior: Behavior normal.      UC Treatments / Results  Labs (all labs ordered are listed, but only abnormal results are displayed) Labs Reviewed - No data to display  EKG   Radiology No results found.  Procedures Laceration Repair  Date/Time: 05/17/2019 6:04 PM Performed by: Raylene Everts, MD Authorized by: Raylene Everts, MD   Consent:    Consent obtained:  Verbal   Consent given by:  Patient   Risks discussed:  Infection and need for additional repair   Alternatives discussed:  No treatment Anesthesia (see MAR for exact dosages):    Anesthesia method:  Local infiltration   Local anesthetic:  Lidocaine 1% WITH epi Laceration details:    Location:  Finger   Finger location:  R long finger   Length (cm):  2.5   Depth (mm):  6 Pre-procedure details:    Preparation:  Patient was prepped and draped in usual sterile fashion Exploration:    Hemostasis achieved with:  Direct pressure   Wound exploration: wound explored through full range of motion     Wound extent: no foreign bodies/material noted, no nerve damage noted and no tendon damage noted     Contaminated: no   Treatment:    Area cleansed with:  Betadine   Amount of cleaning:  Standard Skin repair:    Repair method:  Sutures   Suture size:  4-0   Suture material:  Prolene   Suture technique:  Simple interrupted   Number of sutures:  5 Approximation:    Approximation:  Close Comments:     Tolerated well    Medications Ordered in UC Medications - No data to display  Initial Impression / Assessment and Plan / UC Course  I have reviewed the triage vital signs and the nursing notes.  Pertinent labs & imaging results that were available during my care of the patient were reviewed by me  and considered in my medical decision making (see chart for details).     Discussed wound care Signs of infection Suture removal Final Clinical Impressions(s) / UC  Diagnoses   Final diagnoses:  Laceration of right middle finger without foreign body without damage to nail, initial encounter     Discharge Instructions     Keep clean and dry Watch area for infection Sutures out in 14 days Call or return sooner for any problems   ED Prescriptions    None     PDMP not reviewed this encounter.   Raylene Everts, MD 05/17/19 [phone]

## 2019-05-17 NOTE — ED Triage Notes (Signed)
Pt states top of plastic jar caved in while closing it approx 1 hour ago and cut his right second finger on middle joint. Bleeding controlled at present 2/2 pt application of dsg. Pt states tetanus is "up to date".

## 2019-05-17 NOTE — Discharge Instructions (Signed)
Keep clean and dry Watch area for infection Sutures out in 14 days Call or return sooner for any problems

## 2019-05-31 DIAGNOSIS — M9906 Segmental and somatic dysfunction of lower extremity: Secondary | ICD-10-CM | POA: Diagnosis not present

## 2019-05-31 DIAGNOSIS — Z1331 Encounter for screening for depression: Secondary | ICD-10-CM | POA: Diagnosis not present

## 2019-05-31 DIAGNOSIS — Z85820 Personal history of malignant melanoma of skin: Secondary | ICD-10-CM | POA: Diagnosis not present

## 2019-05-31 DIAGNOSIS — G4733 Obstructive sleep apnea (adult) (pediatric): Secondary | ICD-10-CM | POA: Diagnosis not present

## 2019-05-31 DIAGNOSIS — N401 Enlarged prostate with lower urinary tract symptoms: Secondary | ICD-10-CM | POA: Diagnosis not present

## 2019-05-31 DIAGNOSIS — M169 Osteoarthritis of hip, unspecified: Secondary | ICD-10-CM | POA: Diagnosis not present

## 2019-05-31 DIAGNOSIS — I10 Essential (primary) hypertension: Secondary | ICD-10-CM | POA: Diagnosis not present

## 2019-05-31 DIAGNOSIS — M9903 Segmental and somatic dysfunction of lumbar region: Secondary | ICD-10-CM | POA: Diagnosis not present

## 2019-05-31 DIAGNOSIS — M9905 Segmental and somatic dysfunction of pelvic region: Secondary | ICD-10-CM | POA: Diagnosis not present

## 2019-05-31 DIAGNOSIS — N529 Male erectile dysfunction, unspecified: Secondary | ICD-10-CM | POA: Diagnosis not present

## 2019-06-11 ENCOUNTER — Other Ambulatory Visit: Payer: Self-pay | Admitting: Sports Medicine

## 2019-07-12 DIAGNOSIS — L739 Follicular disorder, unspecified: Secondary | ICD-10-CM | POA: Diagnosis not present

## 2019-07-12 DIAGNOSIS — D485 Neoplasm of uncertain behavior of skin: Secondary | ICD-10-CM | POA: Diagnosis not present

## 2019-07-26 DIAGNOSIS — M9904 Segmental and somatic dysfunction of sacral region: Secondary | ICD-10-CM | POA: Diagnosis not present

## 2019-07-26 DIAGNOSIS — I10 Essential (primary) hypertension: Secondary | ICD-10-CM | POA: Diagnosis not present

## 2019-07-26 DIAGNOSIS — M9905 Segmental and somatic dysfunction of pelvic region: Secondary | ICD-10-CM | POA: Diagnosis not present

## 2019-07-26 DIAGNOSIS — N401 Enlarged prostate with lower urinary tract symptoms: Secondary | ICD-10-CM | POA: Diagnosis not present

## 2019-07-26 DIAGNOSIS — M7662 Achilles tendinitis, left leg: Secondary | ICD-10-CM | POA: Diagnosis not present

## 2019-07-26 DIAGNOSIS — N529 Male erectile dysfunction, unspecified: Secondary | ICD-10-CM | POA: Diagnosis not present

## 2019-07-26 DIAGNOSIS — M9906 Segmental and somatic dysfunction of lower extremity: Secondary | ICD-10-CM | POA: Diagnosis not present

## 2019-07-26 DIAGNOSIS — M9903 Segmental and somatic dysfunction of lumbar region: Secondary | ICD-10-CM | POA: Diagnosis not present

## 2019-08-16 DIAGNOSIS — R269 Unspecified abnormalities of gait and mobility: Secondary | ICD-10-CM | POA: Diagnosis not present

## 2019-08-16 DIAGNOSIS — M79672 Pain in left foot: Secondary | ICD-10-CM | POA: Diagnosis not present

## 2019-08-29 DIAGNOSIS — M7662 Achilles tendinitis, left leg: Secondary | ICD-10-CM | POA: Diagnosis not present

## 2019-08-29 DIAGNOSIS — M9906 Segmental and somatic dysfunction of lower extremity: Secondary | ICD-10-CM | POA: Diagnosis not present

## 2019-08-29 DIAGNOSIS — N401 Enlarged prostate with lower urinary tract symptoms: Secondary | ICD-10-CM | POA: Diagnosis not present

## 2019-08-29 DIAGNOSIS — M9903 Segmental and somatic dysfunction of lumbar region: Secondary | ICD-10-CM | POA: Diagnosis not present

## 2019-08-29 DIAGNOSIS — N529 Male erectile dysfunction, unspecified: Secondary | ICD-10-CM | POA: Diagnosis not present

## 2019-08-29 DIAGNOSIS — M9905 Segmental and somatic dysfunction of pelvic region: Secondary | ICD-10-CM | POA: Diagnosis not present

## 2019-08-29 DIAGNOSIS — M9904 Segmental and somatic dysfunction of sacral region: Secondary | ICD-10-CM | POA: Diagnosis not present

## 2019-10-30 ENCOUNTER — Other Ambulatory Visit (HOSPITAL_COMMUNITY): Payer: Self-pay | Admitting: Urology

## 2019-10-30 DIAGNOSIS — N400 Enlarged prostate without lower urinary tract symptoms: Secondary | ICD-10-CM | POA: Diagnosis not present

## 2019-10-30 DIAGNOSIS — N5201 Erectile dysfunction due to arterial insufficiency: Secondary | ICD-10-CM | POA: Diagnosis not present

## 2019-10-30 DIAGNOSIS — R350 Frequency of micturition: Secondary | ICD-10-CM | POA: Diagnosis not present

## 2019-10-30 DIAGNOSIS — R3915 Urgency of urination: Secondary | ICD-10-CM | POA: Diagnosis not present

## 2019-11-13 DIAGNOSIS — M79672 Pain in left foot: Secondary | ICD-10-CM | POA: Diagnosis not present

## 2019-11-13 DIAGNOSIS — R269 Unspecified abnormalities of gait and mobility: Secondary | ICD-10-CM | POA: Diagnosis not present

## 2019-11-14 DIAGNOSIS — H6981 Other specified disorders of Eustachian tube, right ear: Secondary | ICD-10-CM | POA: Diagnosis not present

## 2019-11-14 DIAGNOSIS — H60501 Unspecified acute noninfective otitis externa, right ear: Secondary | ICD-10-CM | POA: Diagnosis not present

## 2019-11-14 DIAGNOSIS — R0981 Nasal congestion: Secondary | ICD-10-CM | POA: Diagnosis not present

## 2019-11-14 DIAGNOSIS — J302 Other seasonal allergic rhinitis: Secondary | ICD-10-CM | POA: Diagnosis not present

## 2019-12-05 ENCOUNTER — Other Ambulatory Visit: Payer: Self-pay

## 2019-12-05 ENCOUNTER — Ambulatory Visit (INDEPENDENT_AMBULATORY_CARE_PROVIDER_SITE_OTHER): Payer: 59 | Admitting: Family Medicine

## 2019-12-05 ENCOUNTER — Ambulatory Visit: Payer: Self-pay

## 2019-12-05 DIAGNOSIS — M7660 Achilles tendinitis, unspecified leg: Secondary | ICD-10-CM

## 2019-12-05 DIAGNOSIS — S86012A Strain of left Achilles tendon, initial encounter: Secondary | ICD-10-CM | POA: Diagnosis not present

## 2019-12-05 DIAGNOSIS — M79672 Pain in left foot: Secondary | ICD-10-CM | POA: Diagnosis not present

## 2019-12-05 DIAGNOSIS — R269 Unspecified abnormalities of gait and mobility: Secondary | ICD-10-CM | POA: Diagnosis not present

## 2019-12-05 MED ORDER — NITROGLYCERIN 0.2 MG/HR TD PT24: MEDICATED_PATCH | TRANSDERMAL | 11 refills | Status: DC

## 2019-12-05 NOTE — Patient Instructions (Signed)
Nice to meet you Please try the nitro patches  Please try the heel lifts and boot  Please try the exercises when you can walk without pain   Please send me a message in MyChart with any questions or updates.  Please see me back in 4 weeks.   --Dr. Raeford Razor  Nitroglycerin Protocol   Apply 1/4 nitroglycerin patch to affected area daily.  Change position of patch within the affected area every 24 hours.  You may experience a headache during the first 1-2 weeks of using the patch, these should subside.  If you experience headaches after beginning nitroglycerin patch treatment, you may take your preferred over the counter pain reliever.  Another side effect of the nitroglycerin patch is skin irritation or rash related to patch adhesive.  Please notify our office if you develop more severe headaches or rash, and stop the patch.  Tendon healing with nitroglycerin patch may require 12 to 24 weeks depending on the extent of injury.  Men should not use if taking Viagra, Cialis, or Levitra.   Do not use if you have migraines or rosacea.

## 2019-12-05 NOTE — Assessment & Plan Note (Signed)
Felt initial change about 4 weeks ago.  Has not had any improvement thus far.  Large bursitis as well as widening of the Achilles. -Counseled on home exercise therapy and supportive care. -Heel lifts. -Cam walker. -Could consider retrocalcaneal bursa injection -Nitro patches.

## 2019-12-05 NOTE — Progress Notes (Signed)
Lance Davis - 64 y.o. male MRN [id number]  Date of birth: [birth year]  SUBJECTIVE:  Including CC & ROS.  No chief complaint on file.   Lance Davis is a 64 y.o. male that is  Presenting with left Achilles pain.  About 4 weeks ago he was in the parking lot and felt a pop in the back of his ankle.  Since then he has had swelling and pain at the Achilles.  Has been going through physical therapy but has not noticed improvement.  Denies any history of similar pain or surgery.   Review of Systems See HPI   HISTORY: Past Medical, Surgical, Social, and Family History Reviewed & Updated per EMR.   Pertinent Historical Findings include:  Past Medical History:  Diagnosis Date  . Allergy    spring time  . Arthritis   . Atypical nevus 04/26/2016   mild - right mid back  . Blood transfusion 1963  . Cancer ([place]) 2019   melanoma face  . Clark level II melanoma ([place]) 12/13/2017   left inner cheek  . Sleep apnea    cpap  . Squamous cell carcinoma in situ of skin 04/26/2016   inner left cheek    Past Surgical History:  Procedure Laterality Date  . COLONOSCOPY    . I & D EXTREMITY  07/14/2011   Procedure: IRRIGATION AND DEBRIDEMENT EXTREMITY;  Surgeon: Linna Hoff, MD;  Location: [facility];  Service: Orthopedics;  Laterality: Left;  . POLYPECTOMY    . TENDON REPAIR  07/14/2011   Procedure: TENDON REPAIR;  Surgeon: Linna Hoff, MD;  Location: [place];  Service: Orthopedics;  Laterality: Left;    Family History  Problem Relation Age of Onset  . Diabetes Mother   . Heart disease Mother   . Diabetes Sister   . Diabetes Brother   . Colon cancer Neg Hx   . Colon polyps Neg Hx   . Esophageal cancer Neg Hx   . Stomach cancer Neg Hx   . Rectal cancer Neg Hx     Social History   Socioeconomic History  . Marital status: Married    Spouse name: Not on file  . Number of children: Not on file  . Years of education: Not on file  . Highest education level: Not on file  Occupational  History  . Not on file  Tobacco Use  . Smoking status: Never Smoker  . Smokeless tobacco: Never Used  Vaping Use  . Vaping Use: Never used  Substance and Sexual Activity  . Alcohol use: Yes    Alcohol/week: 14.0 standard drinks    Types: 14 Glasses of wine per week  . Drug use: No  . Sexual activity: Not on file  Other Topics Concern  . Not on file  Social History Narrative  . Not on file   Social Determinants of Health   Financial Resource Strain:   . Difficulty of Paying Living Expenses: Not on file  Food Insecurity:   . Worried About Charity fundraiser in the Last Year: Not on file  . Ran Out of Food in the Last Year: Not on file  Transportation Needs:   . Lack of Transportation (Medical): Not on file  . Lack of Transportation (Non-Medical): Not on file  Physical Activity:   . Days of Exercise per Week: Not on file  . Minutes of Exercise per Session: Not on file  Stress:   . Feeling of Stress : Not on  file  Social Connections:   . Frequency of Communication with Friends and Family: Not on file  . Frequency of Social Gatherings with Friends and Family: Not on file  . Attends Religious Services: Not on file  . Active Member of Clubs or Organizations: Not on file  . Attends Archivist Meetings: Not on file  . Marital Status: Not on file  Intimate Partner Violence:   . Fear of Current or Ex-Partner: Not on file  . Emotionally Abused: Not on file  . Physically Abused: Not on file  . Sexually Abused: Not on file     PHYSICAL EXAM:  VS: There were no vitals taken for this visit. Physical Exam Gen: NAD, alert, cooperative with exam, well-appearing MSK:  Left Achilles: No ecchymosis. Swelling over the Achilles. Haglund's deformity. Plantarflexion with [place] Silos test.   Limited ultrasound: Left Achilles:  No changes at the musculotendinous junction. Significant thickening at the mid belly.  Increased hyperemia appreciated deep.  There is hypoechoic  change in the center portion to suggest a partial tear. Large retrocalcaneal bursitis. No changes at the insertion.  Summary: Partial tear of the Achilles with large retrocalcaneal bursitis.  Ultrasound and interpretation by Clearance Coots, MD    ASSESSMENT & PLAN:   Partial tear of left Achilles tendon Felt initial change about 4 weeks ago.  Has not had any improvement thus far.  Large bursitis as well as widening of the Achilles. -Counseled on home exercise therapy and supportive care. -Heel lifts. -Cam walker. -Could consider retrocalcaneal bursa injection -Nitro patches.

## 2019-12-06 DIAGNOSIS — S86012A Strain of left Achilles tendon, initial encounter: Secondary | ICD-10-CM | POA: Diagnosis not present

## 2019-12-08 DIAGNOSIS — S61210A Laceration without foreign body of right index finger without damage to nail, initial encounter: Secondary | ICD-10-CM | POA: Diagnosis not present

## 2019-12-11 ENCOUNTER — Telehealth: Payer: Self-pay | Admitting: Family Medicine

## 2019-12-11 NOTE — Telephone Encounter (Signed)
Per patient he has been wearing Air Cast since Wednesday 9/1 but by Friday it was hurting & brusing area above ankle. How there is a skin torn/sore frm friction of it rubbing against skin--Pt also states he thinks its to small for his foot (as his toes are hanging off the front of boot ).  --Forwarding update to provider & med asst for patient Advice should he stop wearing/take out some of the foam cushioning or can we order a different boot for him?  Please advise.  --glh

## 2020-01-09 DIAGNOSIS — E785 Hyperlipidemia, unspecified: Secondary | ICD-10-CM | POA: Diagnosis not present

## 2020-01-09 DIAGNOSIS — I10 Essential (primary) hypertension: Secondary | ICD-10-CM | POA: Diagnosis not present

## 2020-01-09 DIAGNOSIS — Z0000 Encounter for general adult medical examination without abnormal findings: Secondary | ICD-10-CM | POA: Diagnosis not present

## 2020-01-09 DIAGNOSIS — Z125 Encounter for screening for malignant neoplasm of prostate: Secondary | ICD-10-CM | POA: Diagnosis not present

## 2020-01-10 DIAGNOSIS — E785 Hyperlipidemia, unspecified: Secondary | ICD-10-CM | POA: Diagnosis not present

## 2020-01-10 DIAGNOSIS — Z0000 Encounter for general adult medical examination without abnormal findings: Secondary | ICD-10-CM | POA: Diagnosis not present

## 2020-01-10 DIAGNOSIS — M7662 Achilles tendinitis, left leg: Secondary | ICD-10-CM | POA: Diagnosis not present

## 2020-01-10 DIAGNOSIS — N529 Male erectile dysfunction, unspecified: Secondary | ICD-10-CM | POA: Diagnosis not present

## 2020-01-10 DIAGNOSIS — I10 Essential (primary) hypertension: Secondary | ICD-10-CM | POA: Diagnosis not present

## 2020-01-10 DIAGNOSIS — N401 Enlarged prostate with lower urinary tract symptoms: Secondary | ICD-10-CM | POA: Diagnosis not present

## 2020-01-10 DIAGNOSIS — Z23 Encounter for immunization: Secondary | ICD-10-CM | POA: Diagnosis not present

## 2020-01-10 DIAGNOSIS — Z85820 Personal history of malignant melanoma of skin: Secondary | ICD-10-CM | POA: Diagnosis not present

## 2020-01-10 DIAGNOSIS — R82998 Other abnormal findings in urine: Secondary | ICD-10-CM | POA: Diagnosis not present

## 2020-01-10 DIAGNOSIS — M169 Osteoarthritis of hip, unspecified: Secondary | ICD-10-CM | POA: Diagnosis not present

## 2020-01-11 ENCOUNTER — Ambulatory Visit: Payer: 59 | Attending: Internal Medicine

## 2020-01-11 DIAGNOSIS — Z23 Encounter for immunization: Secondary | ICD-10-CM

## 2020-01-11 NOTE — Progress Notes (Signed)
   Covid-19 Vaccination Clinic  Name:  Lance Davis    MRN: [id number] DOB: 1955/06/09  01/11/2020  [name] was observed post Covid-19 immunization for 15 minutes without incident. He was provided with Vaccine Information Sheet and instruction to access the V-Safe system.   [name] was instructed to call 911 with any severe reactions post vaccine: [name] Kitchen Difficulty breathing  . Swelling of face and throat  . A fast heartbeat  . A bad rash all over body  . Dizziness and weakness

## 2020-01-16 ENCOUNTER — Other Ambulatory Visit (HOSPITAL_COMMUNITY): Payer: Self-pay | Admitting: Optometry

## 2020-01-16 DIAGNOSIS — H5203 Hypermetropia, bilateral: Secondary | ICD-10-CM | POA: Diagnosis not present

## 2020-01-16 DIAGNOSIS — H52223 Regular astigmatism, bilateral: Secondary | ICD-10-CM | POA: Diagnosis not present

## 2020-01-16 DIAGNOSIS — H00022 Hordeolum internum right lower eyelid: Secondary | ICD-10-CM | POA: Diagnosis not present

## 2020-01-16 DIAGNOSIS — H524 Presbyopia: Secondary | ICD-10-CM | POA: Diagnosis not present

## 2020-02-04 ENCOUNTER — Other Ambulatory Visit: Payer: Self-pay | Admitting: Internal Medicine

## 2020-02-04 DIAGNOSIS — E785 Hyperlipidemia, unspecified: Secondary | ICD-10-CM

## 2020-02-21 ENCOUNTER — Ambulatory Visit
Admission: RE | Admit: 2020-02-21 | Discharge: 2020-02-21 | Disposition: A | Discharge status: Home / Self Care | Payer: 59 | Source: Ambulatory Visit | Attending: Internal Medicine | Admitting: Internal Medicine

## 2020-02-21 DIAGNOSIS — E7800 Pure hypercholesterolemia, unspecified: Secondary | ICD-10-CM | POA: Diagnosis not present

## 2020-02-21 DIAGNOSIS — E785 Hyperlipidemia, unspecified: Secondary | ICD-10-CM

## 2020-03-03 ENCOUNTER — Other Ambulatory Visit (HOSPITAL_COMMUNITY): Payer: Self-pay | Admitting: Internal Medicine

## 2020-03-04 ENCOUNTER — Other Ambulatory Visit: Payer: Self-pay | Admitting: Internal Medicine

## 2020-03-04 DIAGNOSIS — I719 Aortic aneurysm of unspecified site, without rupture: Secondary | ICD-10-CM

## 2020-03-14 ENCOUNTER — Other Ambulatory Visit: Payer: 59

## 2020-04-23 DIAGNOSIS — Z1152 Encounter for screening for COVID-19: Secondary | ICD-10-CM | POA: Diagnosis not present

## 2020-05-14 DIAGNOSIS — G4733 Obstructive sleep apnea (adult) (pediatric): Secondary | ICD-10-CM | POA: Diagnosis not present

## 2020-06-03 ENCOUNTER — Other Ambulatory Visit (HOSPITAL_COMMUNITY): Payer: Self-pay | Admitting: Internal Medicine

## 2020-06-24 DIAGNOSIS — L578 Other skin changes due to chronic exposure to nonionizing radiation: Secondary | ICD-10-CM | POA: Diagnosis not present

## 2020-06-24 DIAGNOSIS — L821 Other seborrheic keratosis: Secondary | ICD-10-CM | POA: Diagnosis not present

## 2020-06-24 DIAGNOSIS — D1801 Hemangioma of skin and subcutaneous tissue: Secondary | ICD-10-CM | POA: Diagnosis not present

## 2020-06-24 DIAGNOSIS — L820 Inflamed seborrheic keratosis: Secondary | ICD-10-CM | POA: Diagnosis not present

## 2020-06-24 DIAGNOSIS — L814 Other melanin hyperpigmentation: Secondary | ICD-10-CM | POA: Diagnosis not present

## 2020-06-24 DIAGNOSIS — Z85820 Personal history of malignant melanoma of skin: Secondary | ICD-10-CM | POA: Diagnosis not present

## 2020-06-24 DIAGNOSIS — L570 Actinic keratosis: Secondary | ICD-10-CM | POA: Diagnosis not present

## 2020-07-08 ENCOUNTER — Other Ambulatory Visit (HOSPITAL_COMMUNITY): Payer: Self-pay

## 2020-07-08 MED FILL — Losartan Potassium Tab 100 MG: ORAL | 30 days supply | Qty: 30 | Fill #0 | Status: AC

## 2020-07-24 DIAGNOSIS — L578 Other skin changes due to chronic exposure to nonionizing radiation: Secondary | ICD-10-CM | POA: Diagnosis not present

## 2020-07-24 DIAGNOSIS — L821 Other seborrheic keratosis: Secondary | ICD-10-CM | POA: Diagnosis not present

## 2020-07-24 DIAGNOSIS — D0439 Carcinoma in situ of skin of other parts of face: Secondary | ICD-10-CM | POA: Diagnosis not present

## 2020-07-24 DIAGNOSIS — D485 Neoplasm of uncertain behavior of skin: Secondary | ICD-10-CM | POA: Diagnosis not present

## 2020-07-24 DIAGNOSIS — L570 Actinic keratosis: Secondary | ICD-10-CM | POA: Diagnosis not present

## 2020-08-06 ENCOUNTER — Other Ambulatory Visit (HOSPITAL_COMMUNITY): Payer: Self-pay

## 2020-08-06 MED FILL — Tamsulosin HCl Cap 0.4 MG: ORAL | 90 days supply | Qty: 90 | Fill #0 | Status: AC

## 2020-08-06 MED FILL — Losartan Potassium Tab 100 MG: ORAL | 30 days supply | Qty: 30 | Fill #1 | Status: CN

## 2020-08-07 ENCOUNTER — Other Ambulatory Visit (HOSPITAL_COMMUNITY): Payer: Self-pay

## 2020-08-07 MED ORDER — FLUOROURACIL 5 % EX CREA
TOPICAL_CREAM | CUTANEOUS | 0 refills | Status: DC
  Filled 2020-08-07: qty 40, 18d supply, fill #0

## 2020-08-07 MED FILL — Losartan Potassium Tab 100 MG: ORAL | 90 days supply | Qty: 90 | Fill #1 | Status: AC

## 2020-08-08 ENCOUNTER — Other Ambulatory Visit (HOSPITAL_COMMUNITY): Payer: Self-pay

## 2020-08-20 ENCOUNTER — Other Ambulatory Visit (HOSPITAL_COMMUNITY): Payer: Self-pay

## 2020-08-20 DIAGNOSIS — R911 Solitary pulmonary nodule: Secondary | ICD-10-CM | POA: Diagnosis not present

## 2020-08-20 DIAGNOSIS — G4700 Insomnia, unspecified: Secondary | ICD-10-CM | POA: Diagnosis not present

## 2020-08-20 DIAGNOSIS — M169 Osteoarthritis of hip, unspecified: Secondary | ICD-10-CM | POA: Diagnosis not present

## 2020-08-20 DIAGNOSIS — N281 Cyst of kidney, acquired: Secondary | ICD-10-CM | POA: Diagnosis not present

## 2020-08-20 DIAGNOSIS — I10 Essential (primary) hypertension: Secondary | ICD-10-CM | POA: Diagnosis not present

## 2020-08-20 DIAGNOSIS — E785 Hyperlipidemia, unspecified: Secondary | ICD-10-CM | POA: Diagnosis not present

## 2020-08-20 DIAGNOSIS — G4733 Obstructive sleep apnea (adult) (pediatric): Secondary | ICD-10-CM | POA: Diagnosis not present

## 2020-08-20 DIAGNOSIS — I700 Atherosclerosis of aorta: Secondary | ICD-10-CM | POA: Diagnosis not present

## 2020-08-20 DIAGNOSIS — I719 Aortic aneurysm of unspecified site, without rupture: Secondary | ICD-10-CM | POA: Diagnosis not present

## 2020-08-20 MED ORDER — ZOLPIDEM TARTRATE 5 MG PO TABS
2.5000 mg | ORAL_TABLET | Freq: Every evening | ORAL | 0 refills | Status: DC | PRN
  Filled 2020-08-20: qty 10, 30d supply, fill #0

## 2020-08-21 ENCOUNTER — Ambulatory Visit: Payer: 59

## 2020-08-21 ENCOUNTER — Other Ambulatory Visit (HOSPITAL_COMMUNITY): Payer: Self-pay

## 2020-08-22 ENCOUNTER — Other Ambulatory Visit (HOSPITAL_COMMUNITY): Payer: Self-pay

## 2020-08-22 MED ORDER — ATORVASTATIN CALCIUM 40 MG PO TABS
40.0000 mg | ORAL_TABLET | Freq: Every evening | ORAL | 3 refills | Status: DC
  Filled 2020-08-22 – 2020-09-08 (×2): qty 90, 90d supply, fill #0

## 2020-08-29 ENCOUNTER — Other Ambulatory Visit: Payer: Self-pay | Admitting: Internal Medicine

## 2020-08-29 DIAGNOSIS — I700 Atherosclerosis of aorta: Secondary | ICD-10-CM

## 2020-08-29 DIAGNOSIS — I719 Aortic aneurysm of unspecified site, without rupture: Secondary | ICD-10-CM

## 2020-09-03 ENCOUNTER — Other Ambulatory Visit (HOSPITAL_COMMUNITY): Payer: Self-pay

## 2020-09-03 DIAGNOSIS — H524 Presbyopia: Secondary | ICD-10-CM | POA: Diagnosis not present

## 2020-09-03 DIAGNOSIS — H0011 Chalazion right upper eyelid: Secondary | ICD-10-CM | POA: Diagnosis not present

## 2020-09-03 DIAGNOSIS — H25019 Cortical age-related cataract, unspecified eye: Secondary | ICD-10-CM | POA: Diagnosis not present

## 2020-09-03 DIAGNOSIS — H5203 Hypermetropia, bilateral: Secondary | ICD-10-CM | POA: Diagnosis not present

## 2020-09-03 DIAGNOSIS — I10 Essential (primary) hypertension: Secondary | ICD-10-CM | POA: Diagnosis not present

## 2020-09-03 DIAGNOSIS — H35033 Hypertensive retinopathy, bilateral: Secondary | ICD-10-CM | POA: Diagnosis not present

## 2020-09-03 DIAGNOSIS — H52223 Regular astigmatism, bilateral: Secondary | ICD-10-CM | POA: Diagnosis not present

## 2020-09-03 DIAGNOSIS — H53002 Unspecified amblyopia, left eye: Secondary | ICD-10-CM | POA: Diagnosis not present

## 2020-09-07 MED FILL — Atorvastatin Calcium Tab 20 MG (Base Equivalent): ORAL | 90 days supply | Qty: 90 | Fill #0 | Status: CN

## 2020-09-08 ENCOUNTER — Other Ambulatory Visit (HOSPITAL_COMMUNITY): Payer: Self-pay

## 2020-09-10 ENCOUNTER — Inpatient Hospital Stay: Admission: RE | Admit: 2020-09-10 | Payer: 59 | Source: Ambulatory Visit

## 2020-10-08 ENCOUNTER — Ambulatory Visit: Payer: 59 | Attending: Internal Medicine

## 2020-10-08 ENCOUNTER — Other Ambulatory Visit: Payer: Self-pay

## 2020-10-08 ENCOUNTER — Other Ambulatory Visit (HOSPITAL_COMMUNITY): Payer: Self-pay

## 2020-10-08 ENCOUNTER — Other Ambulatory Visit (HOSPITAL_BASED_OUTPATIENT_CLINIC_OR_DEPARTMENT_OTHER): Payer: Self-pay

## 2020-10-08 DIAGNOSIS — Z23 Encounter for immunization: Secondary | ICD-10-CM

## 2020-10-08 MED ORDER — COVID-19 MRNA VACC (MODERNA) 100 MCG/0.5ML IM SUSP
INTRAMUSCULAR | 0 refills | Status: DC
  Filled 2020-10-08: qty 0.25, 1d supply, fill #0

## 2020-10-08 MED ORDER — CARESTART COVID-19 HOME TEST VI KIT
PACK | 0 refills | Status: DC
  Filled 2020-10-08: qty 4, 4d supply, fill #0

## 2020-10-09 ENCOUNTER — Telehealth: Payer: 59 | Admitting: Physician Assistant

## 2020-10-09 ENCOUNTER — Other Ambulatory Visit (HOSPITAL_COMMUNITY): Payer: Self-pay

## 2020-10-09 DIAGNOSIS — E1169 Type 2 diabetes mellitus with other specified complication: Secondary | ICD-10-CM | POA: Diagnosis not present

## 2020-10-09 MED ORDER — METFORMIN HCL 500 MG PO TABS
500.0000 mg | ORAL_TABLET | Freq: Every day | ORAL | 0 refills | Status: DC
  Filled 2020-10-09: qty 30, 30d supply, fill #0

## 2020-10-09 NOTE — Progress Notes (Signed)
Virtual Visit Consent   Lance Davis, you are scheduled for a virtual visit with a [facility] provider today.     Just as with appointments in the office, your consent must be obtained to participate.  Your consent will be active for this visit and any virtual visit you may have with one of our providers in the next 365 days.     If you have a MyChart account, a copy of this consent can be sent to you electronically.  All virtual visits are billed to your insurance company just like a traditional visit in the office.    As this is a virtual visit, video technology does not allow for your provider to perform a traditional examination.  This may limit your provider's ability to fully assess your condition.  If your provider identifies any concerns that need to be evaluated in person or the need to arrange testing (such as labs, EKG, etc.), we will make arrangements to do so.     Although advances in technology are sophisticated, we cannot ensure that it will always work on either your end or our end.  If the connection with a video visit is poor, the visit may have to be switched to a telephone visit.  With either a video or telephone visit, we are not always able to ensure that we have a secure connection.     I need to obtain your verbal consent now.   Are you willing to proceed with your visit today?    Lance Davis has provided verbal consent on 10/09/2020 for a virtual visit (video or telephone).   Leeanne Rio, [place]   Date: 10/09/2020 11:23 AM   Virtual Visit via Video Note   I, Leeanne Rio, connected with  Lance Davis  ([id number], 21-Jun-1955) on 10/09/20 at 11:00 AM EDT by a video-enabled telemedicine application and verified that I am speaking with the correct person using two identifiers.  Location: Patient: Virtual Visit Location Patient: Home Provider: Virtual Visit Location Provider: Home Office   I discussed the limitations of evaluation and  management by telemedicine and the availability of in person appointments. The patient expressed understanding and agreed to proceed.    History of Present Illness: Lance Davis is a 65 y.o. who identifies as a male who was assigned male at birth, and is being seen today for concerns of elevated glucose and type 2 diabetes mellitus.  Patient endorses history of prediabetes which was previously controlled with diet and exercise.  Has noted some fatigue over the past couple months and as such he started checking his sugars, especially given he has a significant family history of type 2 diabetes.  Has noted over the past few weeks that his fasting glucoses averaging in the 150s to 160s.  Notes glucose later in the day can vary highly but is usually a bit lower as he has been trying to exercise more.  Has noted some occasional blurring of vision, polyuria and nocturia (increased from his baseline with his BPH).  Is also started noticing some decreased sensation in his feet bilaterally.  Denies any tingling or burning sensation.  He is currently between primary care provider have a appointment next week but he is very concerned about his sugars running higher in the meantime.  Denies any headache, nausea or vomiting, altered mental status.  HPI: HPI  Problems:  Patient Active Problem List   Diagnosis Date Noted   Partial tear of left  Achilles tendon 12/05/2019   Osteoarthritis of hip 02/10/2017   Groin pain, right 01/13/2017    Allergies: No Known Allergies Medications:  Current Outpatient Medications:    sildenafil (REVATIO) 20 MG tablet, Take 20 mg by mouth as needed., Disp: , Rfl:    atorvastatin (LIPITOR) 40 MG tablet, Take 1 tablet (40 mg total) by mouth at night. If myalgias occur, hold or go down to 1/2 tablet (20mg ) for 1-2 weeks, then restart., Disp: 90 tablet, Rfl: 3   COVID-19 mRNA vaccine, Moderna, 100 MCG/0.5ML injection, Inject into the muscle., Disp: 0.25 mL, Rfl: 0   losartan  (COZAAR) 100 MG tablet, TAKE 1 TABLET BY MOUTH ONCE A DAY, Disp: 90 tablet, Rfl: 3   tamsulosin (FLOMAX) 0.4 MG CAPS capsule, TAKE 1 CAPSULE BY MOUTH EVERY DAY, Disp: 90 capsule, Rfl: 3   zolpidem (AMBIEN) 5 MG tablet, TAKE 1/2 TO 1 TABLET BY MOUTH AT BEDTIME AS NEEDED FOR SLEEP, Disp: 10 tablet, Rfl: 0  Observations/Objective: Patient is well-developed, well-nourished in no acute distress.  Resting comfortably at home.  Head is normocephalic, atraumatic.  No labored breathing. Speech is clear and coherent with logical content.  Patient is alert and oriented at baseline.   Assessment and Plan: 1. Type 2 diabetes mellitus with other specified complication, without long-term current use of insulin (HCC) Multiple fasting glucose levels in the diabetic range with known history of prediabetes and also current symptoms of diabetes.  Thankfully no alarmingly high glucose levels.  He is exhibiting some mild neuropathy symptoms and occasional vision changes.  Does endorse having a recent evaluation with his eye doctor and was told things looked good.  Is currently treated for hypertension and hyperlipidemia, noting these are under good control.  Reviewed dietary and exercise recommendations.  Recommend OTC cinnamon supplement to help with insulin resistance.  He needs to get in with his new primary care provider to get an updated A1c to further confirm diagnosis, as well to update renal function, cholesterol diabetic foot exam, immunizations.  Given his concerns we will start metformin 500 mg once daily until he can get in with his provider for follow-up next week.  Follow Up Instructions: I discussed the assessment and treatment plan with the patient. The patient was provided an opportunity to ask questions and all were answered. The patient agreed with the plan and demonstrated an understanding of the instructions.  A copy of instructions were sent to the patient via MyChart.  The patient was advised to  call back or seek an in-person evaluation if the symptoms worsen or if the condition fails to improve as anticipated.  Time:  I spent 15 minutes with the patient via telehealth technology discussing the above problems/concerns.    Leeanne Rio, PA-C

## 2020-10-09 NOTE — Patient Instructions (Signed)
Lance Davis, thank you for joining Leeanne Rio, PA-C for today's virtual visit.  While this provider is not your primary care provider (PCP), if your PCP is located in our provider database this encounter information will be shared with them immediately following your visit.  Consent: (Patient) Lance Davis provided verbal consent for this virtual visit at the beginning of the encounter.  Current Medications:  Current Outpatient Medications:    aspirin EC 81 MG tablet, Take 81 mg by mouth daily., Disp: , Rfl:    atorvastatin (LIPITOR) 40 MG tablet, Take 1 tablet (40 mg total) by mouth at night. If myalgias occur, hold or go down to 1/2 tablet (61m) for 1-2 weeks, then restart., Disp: 90 tablet, Rfl: 3   cephALEXin (KEFLEX) 500 MG capsule, TAKE 1 CAPSULE BY MOUTH TWICE A DAY AS DIRECTED, Disp: 20 capsule, Rfl: 0   COVID-19 At Home Antigen Test (CARESTART COVID-19 HOME TEST) KIT, Use as directed, Disp: 4 each, Rfl: 0   COVID-19 mRNA vaccine, Moderna, 100 MCG/0.5ML injection, Inject into the muscle., Disp: 0.25 mL, Rfl: 0   fluorouracil (EFUDEX) 5 % cream, APPLY TWICE DAILY TO SKIN CANCER AS DIRECTED FOR 36 DAYS. HOLD FOR SEVERE INFLAMMATION, Disp: 80 g, Rfl: 0   losartan (COZAAR) 100 MG tablet, Take 100 mg by mouth daily., Disp: , Rfl:    losartan (COZAAR) 100 MG tablet, TAKE 1 TABLET BY MOUTH ONCE A DAY, Disp: 90 tablet, Rfl: 3   nitroGLYCERIN (NITRODUR - DOSED IN MG/24 HR) 0.2 mg/hr patch, Cut and apply 1/4 patch to most painful area q24h., Disp: 30 patch, Rfl: 11   tadalafil (CIALIS) 5 MG tablet, Take 5 mg by mouth daily as needed for erectile dysfunction., Disp: , Rfl:    tadalafil (CIALIS) 5 MG tablet, TAKE 1 TABLET BY MOUTH EVERY OTHER DAY, Disp: 45 tablet, Rfl: 3   tamsulosin (FLOMAX) 0.4 MG CAPS capsule, TAKE 1 CAPSULE BY MOUTH EVERY DAY, Disp: 90 capsule, Rfl: 3   zolpidem (AMBIEN) 5 MG tablet, TAKE 1/2 TO 1 TABLET BY MOUTH AT BEDTIME AS NEEDED FOR SLEEP, Disp: 10  tablet, Rfl: 0   Medications ordered in this encounter:  No orders of the defined types were placed in this encounter.    *If you need refills on other medications prior to your next appointment, please contact your pharmacy*  Follow-Up: Call back or seek an in-person evaluation if the symptoms worsen or if the condition fails to improve as anticipated.  Other Instructions Please keep well-hydrated. Continue chronic medications as directed. Start an OTC cinnamon supplement once daily. Take the metformin as directed with food once daily until your follow-up next week with new PCP. Continue to work up to a goal of 150 minutes/week of moderate intensity aerobic exercise.  We also recommend 2 days of light resistance training. I recommend starting an OTC vitamin D3 1000 units supplement daily as well. Follow the dietary recommendations below. Your new primary care will be able to evaluate things further including updating a diabetic foot exam, immunizations and further evaluating your cardiac risks. It is very important that you keep this appointment with them.  Diabetes Mellitus and Nutrition, Adult When you have diabetes, or diabetes mellitus, it is very important to have healthy eating habits because your blood sugar (glucose) levels are greatly affected by what you eat and drink. Eating healthy foods in the right amounts, at about the same times every day, can help you: Control your blood glucose. Lower  your risk of heart disease. Improve your blood pressure. Reach or maintain a healthy weight. What can affect my meal plan? Every person with diabetes is different, and each person has different needs for a meal plan. Your health care provider may recommend that you work with a dietitian to make a meal plan that is best for you. Your meal plan may vary depending on factors such as: The calories you need. The medicines you take. Your weight. Your blood glucose, blood pressure, and  cholesterol levels. Your activity level. Other health conditions you have, such as heart or kidney disease. How do carbohydrates affect me? Carbohydrates, also called carbs, affect your blood glucose level more than any other type of food. Eating carbs naturally raises the amount of glucose in your blood. Carb counting is a method for keeping track of how many carbs you eat. Counting carbs is important to keep your blood glucose at a healthy level,especially if you use insulin or take certain oral diabetes medicines. It is important to know how many carbs you can safely have in each meal. This is different for every person. Your dietitian can help you calculate how manycarbs you should have at each meal and for each snack. How does alcohol affect me? Alcohol can cause a sudden decrease in blood glucose (hypoglycemia), especially if you use insulin or take certain oral diabetes medicines. Hypoglycemia can be a life-threatening condition. Symptoms of hypoglycemia, such as sleepiness, dizziness, and confusion, are similar to symptoms of having too much alcohol. Do not drink alcohol if: Your health care provider tells you not to drink. You are pregnant, may be pregnant, or are planning to become pregnant. If you drink alcohol: Do not drink on an empty stomach. Limit how much you use to: 0-1 drink a day for women. 0-2 drinks a day for men. Be aware of how much alcohol is in your drink. In the [place] one drink equals one 12 oz bottle of beer (355 mL), one 5 oz glass of wine (148 mL), or one 1 oz glass of hard liquor (44 mL). Keep yourself hydrated with water, diet soda, or unsweetened iced tea. Keep in mind that regular soda, juice, and other mixers may contain a lot of sugar and must be counted as carbs. What are tips for following this plan?  Reading food labels Start by checking the serving size on the "Nutrition Facts" label of packaged foods and drinks. The amount of calories, carbs, fats, and  other nutrients listed on the label is based on one serving of the item. Many items contain more than one serving per package. Check the total grams (g) of carbs in one serving. You can calculate the number of servings of carbs in one serving by dividing the total carbs by 15. For example, if a food has 30 g of total carbs per serving, it would be equal to 2 servings of carbs. Check the number of grams (g) of saturated fats and trans fats in one serving. Choose foods that have a low amount or none of these fats. Check the number of milligrams (mg) of salt (sodium) in one serving. Most people should limit total sodium intake to less than 2,300 mg per day. Always check the nutrition information of foods labeled as "low-fat" or "nonfat." These foods may be higher in added sugar or refined carbs and should be avoided. Talk to your dietitian to identify your daily goals for nutrients listed on the label. Shopping Avoid buying canned, pre-made, or  processed foods. These foods tend to be high in fat, sodium, and added sugar. Shop around the outside edge of the grocery store. This is where you will most often find fresh fruits and vegetables, bulk grains, fresh meats, and fresh dairy. Cooking Use low-heat cooking methods, such as baking, instead of high-heat cooking methods like deep frying. Cook using healthy oils, such as olive, canola, or sunflower oil. Avoid cooking with butter, cream, or high-fat meats. Meal planning Eat meals and snacks regularly, preferably at the same times every day. Avoid going long periods of time without eating. Eat foods that are high in fiber, such as fresh fruits, vegetables, beans, and whole grains. Talk with your dietitian about how many servings of carbs you can eat at each meal. Eat 4-6 oz (112-168 g) of lean protein each day, such as lean meat, chicken, fish, eggs, or tofu. One ounce (oz) of lean protein is equal to: 1 oz (28 g) of meat, chicken, or fish. 1 egg.  cup  (62 g) of tofu. Eat some foods each day that contain healthy fats, such as avocado, nuts, seeds, and fish. What foods should I eat? Fruits Berries. Apples. Oranges. Peaches. Apricots. Plums. Grapes. Mango. Papaya.Pomegranate. Kiwi. Cherries. Vegetables Lettuce. Spinach. Leafy greens, including kale, chard, collard greens, and mustard greens. Beets. Cauliflower. Cabbage. Broccoli. Carrots. Green beans.Tomatoes. Peppers. Onions. Cucumbers. Brussels sprouts. Grains Whole grains, such as whole-wheat or whole-grain bread, crackers, tortillas,cereal, and pasta. Unsweetened oatmeal. Quinoa. Brown or wild rice. Meats and other proteins Seafood. Poultry without skin. Lean cuts of poultry and beef. Tofu. Nuts. Seeds. Dairy Low-fat or fat-free dairy products such as milk, yogurt, and cheese. The items listed above may not be a complete list of foods and beverages you can eat. Contact a dietitian for more information. What foods should I avoid? Fruits Fruits canned with syrup. Vegetables Canned vegetables. Frozen vegetables with butter or cream sauce. Grains Refined white flour and flour products such as bread, pasta, snack foods, andcereals. Avoid all processed foods. Meats and other proteins Fatty cuts of meat. Poultry with skin. Breaded or fried meats. Processed meat.Avoid saturated fats. Dairy Full-fat yogurt, cheese, or milk. Beverages Sweetened drinks, such as soda or iced tea. The items listed above may not be a complete list of foods and beverages you should avoid. Contact a dietitian for more information. Questions to ask a health care provider Do I need to meet with a diabetes educator? Do I need to meet with a dietitian? What number can I call if I have questions? When are the best times to check my blood glucose? Where to find more information: American Diabetes Association: diabetes.org Academy of Nutrition and Dietetics: www.eatright.[employer] of Diabetes and  Digestive and Kidney Diseases: [place] Association of Diabetes Care and Education Specialists: www.diabeteseducator.org Summary It is important to have healthy eating habits because your blood sugar (glucose) levels are greatly affected by what you eat and drink. A healthy meal plan will help you control your blood glucose and maintain a healthy lifestyle. Your health care provider may recommend that you work with a dietitian to make a meal plan that is best for you. Keep in mind that carbohydrates (carbs) and alcohol have immediate effects on your blood glucose levels. It is important to count carbs and to use alcohol carefully. This information is not intended to replace advice given to you by your health care provider. Make sure you discuss any questions you have with your healthcare provider. Document Revised: 02/27/2019 Document  Reviewed: 02/27/2019 Elsevier Patient Education  2021 [place].   Diabetes Mellitus and Exercise Exercising regularly is important for overall health, especially for people who have diabetes mellitus. Exercising is not only about losing weight. It has many other health benefits, such as increasing muscle strength and bone density and reducing body fat and stress. This leads to improved fitness, flexibility, andendurance, all of which result in better overall health. What are the benefits of exercise if I have diabetes? Exercise has many benefits for people with diabetes. They include: Helping to lower and control blood sugar (glucose). Helping the body to respond better to the hormone insulin by improving insulin sensitivity. Reducing how much insulin the body needs. Lowering the risk for heart disease by: Lowering "bad" cholesterol and triglyceride levels. Increasing "good" cholesterol levels. Lowering blood pressure. Lowering blood glucose levels. What is my activity plan? Your health care provider or certified diabetes educator can help you  make a plan for the type and frequency of exercise that works for you. This is called your activity plan. Be sure to: Get at least 150 minutes of medium-intensity or high-intensity exercise each week. Exercises may include brisk walking, biking, or water aerobics. Do stretching and strengthening exercises, such as yoga or weight lifting, at least 2 times a week. Spread out your activity over at least 3 days of the week. Get some form of physical activity each day. Do not go more than 2 days in a row without some kind of physical activity. Avoid being inactive for more than 90 minutes at a time. Take frequent breaks to walk or stretch. Choose exercises or activities that you enjoy. Set realistic goals. Start slowly and gradually increase your exercise intensity over time. How do I manage my diabetes during exercise?  Monitor your blood glucose Check your blood glucose before and after exercising. If your blood glucose is: 240 mg/dL (13.3 mmol/L) or higher before you exercise, check your urine for ketones. These are chemicals created by the liver. If you have ketones in your urine, do not exercise until your blood glucose returns to normal. 100 mg/dL (5.6 mmol/L) or lower, eat a snack containing 15-20 grams of carbohydrate. Check your blood glucose 15 minutes after the snack to make sure that your glucose level is above 100 mg/dL (5.6 mmol/L) before you start your exercise. Know the symptoms of low blood glucose (hypoglycemia) and how to treat it. Your risk for hypoglycemia increases during and after exercise. Follow these tips and your health care provider's instructions Keep a carbohydrate snack that is fast-acting for use before, during, and after exercise to help prevent or treat hypoglycemia. Avoid injecting insulin into areas of the body that are going to be exercised. For example, avoid injecting insulin into: Your arms, when you are about to play tennis. Your legs, when you are about to go  jogging. Keep records of your exercise habits. Doing this can help you and your health care provider adjust your diabetes management plan as needed. Write down: Food that you eat before and after you exercise. Blood glucose levels before and after you exercise. The type and amount of exercise you have done. Work with your health care provider when you start a new exercise or activity. He or she may need to: Make sure that the activity is safe for you. Adjust your insulin, other medicines, and food that you eat. Drink plenty of water while you exercise. This prevents loss of water (dehydration) and problems caused by a lot  of heat in the body (heat stroke). Where to find more information American Diabetes Association: www.diabetes.org Summary Exercising regularly is important for overall health, especially for people who have diabetes mellitus. Exercising has many health benefits. It increases muscle strength and bone density and reduces body fat and stress. It also lowers and controls blood glucose. Your health care provider or certified diabetes educator can help you make an activity plan for the type and frequency of exercise that works for you. Work with your health care provider to make sure any new activity is safe for you. Also work with your health care provider to adjust your insulin, other medicines, and the food you eat. This information is not intended to replace advice given to you by your health care provider. Make sure you discuss any questions you have with your healthcare provider. Document Revised: 12/18/2018 Document Reviewed: 12/18/2018 Elsevier Patient Education  2022 [employer].    If you have been instructed to have an in-person evaluation today at a local Urgent Care facility, please use the link below. It will take you to a list of all of our available [facility], including address, phone number and hours of operation. Please do not delay care.  [facility]  If you or a family member do not have a primary care provider, use the link below to schedule a visit and establish care. When you choose a [facility] primary care physician or advanced practice provider, you gain a long-term partner in health. Find a Primary Care Provider  Learn more about [facility]'s in-office and virtual care options: [facility]

## 2020-10-15 ENCOUNTER — Ambulatory Visit: Payer: 59 | Admitting: Pharmacist

## 2020-10-15 NOTE — Patient Instructions (Signed)
Visit Information  PATIENT GOALS:   Goals Addressed               This Visit's Progress     Patient Stated     Medication Monitoring (pt-stated)        Patient Goals/Self-Care Activities Over the next 30 days, patient will:  - take medications as prescribed check glucose 1-2 times daily, document, and provide at future appointments         Lance Davis was given information about Care Management services by the embedded care coordination team including:  Care Management services include personalized support from designated clinical staff supervised by his physician, including individualized plan of care and coordination with other care providers 24/7 contact phone numbers for assistance for urgent and routine care needs. The patient may stop CCM services at any time (effective at the end of the month) by phone call to the office staff.  Patient agreed to services and verbal consent obtained.   Follow Up:  Patient agrees to Care Plan and Follow-up.   Plan: Video Visit follow up appointment scheduled: 6 weeks   Catie Darnelle Maffucci, PharmD, [place], [place] Clinical Pharmacist [employer] at [place]

## 2020-10-15 NOTE — Chronic Care Management (AMB) (Addendum)
10/15/2020 Name: Lance Davis MRN: [id number] DOB: Sep 04, 1955  Subjective: I connected with  Lance Davis on 10/15/20 by a video enabled telemedicine application and verified that I am speaking with the correct person using two identifiers.   I discussed the limitations of evaluation and management by telemedicine. The patient expressed understanding and agreed to proceed.   Assessment:  Review of patient status, including review of consultants reports, laboratory and other test data, was performed as part of comprehensive evaluation and provision of chronic care management services.   Objective:  BP Readings from Last 3 Encounters:  05/17/19 (!) 153/72  02/15/18 114/71  02/10/17 (!) 138/95    Care Plan  No Known Allergies  Medications Reviewed Today     Reviewed by De Hollingshead, RPH-CPP (Pharmacist) on 10/15/20 at 1228  Med List Status: <None>   Medication Order Taking? Sig Documenting Provider Last Dose Status Informant  atorvastatin (LIPITOR) 40 MG tablet [id number] Yes Take 1 tablet (40 mg total) by mouth at night. If myalgias occur, hold or go down to 1/2 tablet (44m) for 1-2 weeks, then restart.  Taking Active   COVID-19 At Home Antigen Test ([facility]) KIT [id number] Use as directed [facility] Active   COVID-19 mRNA vaccine, Moderna, 100 MCG/0.5ML injection [id number] Inject into the muscle. SCarlyle Basques MD  Active   losartan (COZAAR) 100 MG tablet [id number] TAKE 1 TABLET BY MOUTH ONCE A DAY SSueanne Margarita DO Taking Active   metFORMIN (GLUCOPHAGE) 500 MG tablet [id number] Take 1 tablet (500 mg total) by mouth daily with breakfast. MBrunetta Jeans PA-C Taking Active   sildenafil (REVATIO) 20 MG tablet [id number] Take 20 mg by mouth as needed. [provider]  Active   tamsulosin (FLOMAX) 0.4 MG CAPS capsule [id number] TAKE 1 CAPSULE BY MOUTH EVERY DAY HArdis Hughs MD Taking Active     Discontinued  05/17/19 1751   zolpidem (AMBIEN) 5 MG tablet [id number] TAKE 1/2 TO 1 TABLET BY MOUTH AT BEDTIME AS NEEDED FOR SLEEP   Active             Patient Active Problem List   Diagnosis Date Noted   Partial tear of left Achilles tendon 12/05/2019   Osteoarthritis of hip 02/10/2017   Groin pain, right 01/13/2017    Care Plan : Medication Management  Updates made by TDe Hollingshead RPH-CPP since 10/15/2020 12:00 AM     Problem: Pre-diabetes, HTN, HLD      Long-Range Goal: Disease Prevention Progression   Start Date: 10/15/2020  This Visit's Progress: On track  Priority: High  Note:   Current Barriers:  Unable to achieve control of blood sugar   Pharmacist Clinical Goal(s):  Over the next 90 days, patient will achieve control of blood sugars as evidenced by A1c  through collaboration with PharmD and provider.   Interventions: Inter-disciplinary care team collaboration (see longitudinal plan of care) Comprehensive medication review performed; medication list updated in electronic medical record  History of Pre-diabetes: Reports a history of several weeks of fatigue, polyuria (though notes this at baseline), and elevated fasting sugars to 150-160s. Post prandial 130-140s. Does not remember his last A1c, but believes it was in the pre-diabetes range. Has not had a recent A1c. Reports [facility]  Glucose levels improving; current treatment: metformin 500 mg QAM started last week by Urgent Care visit -  Reports some initial diarrhea, but improved Current glucose readings: fasting glucose: ~110-120s Current meal patterns: breakfast: cereal, boiled eggs, avoid white bread; lunch: hamburger patty (avoids bread); dinner: various things, focus on lean proteins, vegetables, small portion sizes of carbs; drinks: water, 0-4 glasses of wine daily Current exercise: walking 2 miles daily, [employer], plans to get back into a routine with that,  gym pre-COVID but plans to get back into that as well Extensive education regarding definition of diabetes, A1c, goal of treatment, long term macro and microvascular complications. Discussed goal A1c, goal fasting, and 2 hour post prandial glucose readings.  Educated on mechanism of action of metformin.  Extensive dietary education, discussed overarching principals of lean proteins, vegetables, fruits, whole grains, hydration.  Educated on long term goal of at least 150 minutes of moderate intensity exercise weekly.  Discussed goal of <2 servings of alcohol daily.  Patient's goal is to lose weight and be able to control blood sugars without medication. His plan is to establish with his new PCP in August.   Hypertension: Controlled per patient report; current treatment: losartan 100 mg daily;  Current home readings: did not discuss today Anticipate updated lab work with upcoming new patient appointment. Will evaluate goals and assess treatment moving forward.   Hyperlipidemia: Appropriately managed; current treatment: atorvastatin 40 mg daily;  Current dietary patterns: dietary counseling as above Anticipate updated lab work with upcoming new patient appointment. Will evaluate goals and assess treatment moving forward.   BPH: Appropriately managed; current regimen: tamsulosin 0.4 mg daily, sildenafil 20 mg PRN Will discuss goals of care and appropriate management moving forward.   Insomnia: Appropriately managed; current regimen: zolpidem 5 mg - 1/2-1 tab QPM PRN  Will discuss goals of care and appropriate management moving forward.    Patient Goals/Self-Care Activities Over the next 30 days, patient will:  - take medications as prescribed check glucose 1-2 times daily, document, and provide at future appointments  Follow Up Plan: Video visit scheduled for: ~ 6 weeks      Medication Assistance:  None required.  Patient affirms current coverage meets needs.  Follow Up:  Patient  agrees to Care Plan and Follow-up.  Plan: Video Visit follow up appointment scheduled: 6 weeks  Lance Davis, PharmD, [place], [place] Clinical Pharmacist [employer] at [place]

## 2020-10-23 DIAGNOSIS — N401 Enlarged prostate with lower urinary tract symptoms: Secondary | ICD-10-CM | POA: Diagnosis not present

## 2020-10-23 DIAGNOSIS — R351 Nocturia: Secondary | ICD-10-CM | POA: Diagnosis not present

## 2020-10-30 DIAGNOSIS — R351 Nocturia: Secondary | ICD-10-CM | POA: Diagnosis not present

## 2020-10-30 DIAGNOSIS — N5201 Erectile dysfunction due to arterial insufficiency: Secondary | ICD-10-CM | POA: Diagnosis not present

## 2020-10-30 DIAGNOSIS — N401 Enlarged prostate with lower urinary tract symptoms: Secondary | ICD-10-CM | POA: Diagnosis not present

## 2020-11-05 ENCOUNTER — Encounter (HOSPITAL_BASED_OUTPATIENT_CLINIC_OR_DEPARTMENT_OTHER): Payer: Self-pay | Admitting: Family Medicine

## 2020-11-05 ENCOUNTER — Ambulatory Visit (HOSPITAL_BASED_OUTPATIENT_CLINIC_OR_DEPARTMENT_OTHER): Payer: 59 | Admitting: Family Medicine

## 2020-11-05 ENCOUNTER — Other Ambulatory Visit (HOSPITAL_COMMUNITY): Payer: Self-pay

## 2020-11-05 ENCOUNTER — Other Ambulatory Visit: Payer: Self-pay

## 2020-11-05 VITALS — BP 130/68 | HR 59 | Ht 73.0 in | Wt 259.8 lb

## 2020-11-05 DIAGNOSIS — E1169 Type 2 diabetes mellitus with other specified complication: Secondary | ICD-10-CM

## 2020-11-05 DIAGNOSIS — N400 Enlarged prostate without lower urinary tract symptoms: Secondary | ICD-10-CM | POA: Diagnosis not present

## 2020-11-05 DIAGNOSIS — E669 Obesity, unspecified: Secondary | ICD-10-CM | POA: Diagnosis not present

## 2020-11-05 DIAGNOSIS — R7309 Other abnormal glucose: Secondary | ICD-10-CM | POA: Insufficient documentation

## 2020-11-05 DIAGNOSIS — E785 Hyperlipidemia, unspecified: Secondary | ICD-10-CM

## 2020-11-05 DIAGNOSIS — I10 Essential (primary) hypertension: Secondary | ICD-10-CM | POA: Diagnosis not present

## 2020-11-05 MED ORDER — TAMSULOSIN HCL 0.4 MG PO CAPS
0.4000 mg | ORAL_CAPSULE | Freq: Every day | ORAL | 1 refills | Status: DC
  Filled 2020-11-05: qty 90, 90d supply, fill #0
  Filled 2021-02-05: qty 90, 90d supply, fill #1

## 2020-11-05 MED ORDER — METFORMIN HCL 500 MG PO TABS
500.0000 mg | ORAL_TABLET | Freq: Every day | ORAL | 1 refills | Status: DC
  Filled 2020-11-05: qty 90, 90d supply, fill #0

## 2020-11-05 NOTE — Assessment & Plan Note (Signed)
As above, did discuss injectable GLP-1 agonist medications which can be useful both for diabetes and weight loss We will follow-up on lab work Continue with lifestyle modifications including dietary measures and regular physical activity

## 2020-11-05 NOTE — Assessment & Plan Note (Signed)
Discussed with patient regarding statin therapy, its risks and benefits, particular related to presence of diabetes if this is found to be the case Would recommend continuing with statin therapy at maximally tolerated dose which at this time appears to be 20 mg Check labs as below

## 2020-11-05 NOTE — Patient Instructions (Signed)
  Medication Instructions:  Your physician recommends that you continue on your current medications as directed. Please refer to the Current Medication list given to you today. --If you need a refill on any your medications before your next appointment, please call your pharmacy first. If no refills are authorized on file call the office.--  Lab Work: Your physician has recommended that you have lab work today: CBC, Lipid Profile, CMP, A1C If you have labs (blood work) drawn today and your tests are completely normal, you will receive your results via [place] a phone call from our staff.  Please ensure you check your voicemail in the event that you authorized detailed messages to be left on a delegated number. If you have any lab test that is abnormal or we need to change your treatment, we will call you to review the results.   Follow-Up: Your next appointment:   Your physician recommends that you schedule a follow-up appointment in: 6-8 WEEKS with Dr. [name]  Thanks for letting us be apart of your health journey!!  Primary Care and Sports Medicine   Dr. Arlina Robes [place]   We encourage you to activate your patient portal called "MyChart".  Sign up information is provided on this After Visit Summary.  MyChart is used to connect with patients for Virtual Visits (Telemedicine).  Patients are able to view lab/test results, encounter notes, upcoming appointments, etc.  Non-urgent messages can be sent to your provider as well. To learn more about what you can do with MyChart, please visit --  [email or web address].

## 2020-11-05 NOTE — Assessment & Plan Note (Signed)
Blood pressure at goal in office today Can continue with current regimen including losartan 100 mg daily Recommend intermittent blood pressure checks at home Continue to monitor in the office Recommend lifestyle modifications including DASH diet, regular physical activity

## 2020-11-05 NOTE — Progress Notes (Signed)
New Patient Office Visit  Subjective:  Patient ID: Lance Davis, male    DOB: 1955-05-31  Age: 65 y.o. MRN: [id number]  CC:  Chief Complaint  Patient presents with   Establish Care    Previous PCP Dr. Francesco Sor with [facility]   Medication Management    Patient would like to discuss the current medications he's on and the necessity of them. He would also like to discuss alternative therapy for Atorvastatin. He is prescribed 40 mg but is only taking 20 because he states the 40 mg makes him extremely fatigued and gives him a headache.    Diabetes    Patient would like to discuss blood sugars and DM. He has a strong family his of DM (mother and brother).     HPI Lance Davis is a 65 year old male presenting to establish in clinic.  He has current concerns today related to recent elevation in blood sugars, questions regarding statin therapy.  Reports past medical history of hypertension, hyperlipidemia, BPH, prediabetes/diabetes(?).  Hypertension: Has been managed on losartan, no recent change in medication.  Denies any issues with chest pain, lightheadedness, dizziness, shortness of breath.  Reports that he has been fairly well controlled with this medication.  Hyperlipidemia: Has been prescribed atorvastatin in the past, was treated with 40 mg dose, however at times would have some general body aches, joint pain, generalized fatigue and at times would not take the statin and has more recently been taking half a dose at 20 mg daily.  At lower dose he is not as bothered by the side effects.  Patient is on questions as he is unsure of the necessity of taking statin medication.  Blood sugars: Reports he has been diagnosed with prediabetes in the past.  Was managing with lifestyle modifications.  Reports that more recently he was checking his blood sugars and his fasting readings would be in the 150s to 160s.  He did have a virtual visit related to this about 1 month ago and was  started on low-dose of metformin.  Reports that he had some loose stool initially with this medication, but has been tolerating it well since.  Has been at least 1 year or more since last lab work.  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  Patient does like to golf.  Did have a history of a partial left Achilles tendon tear about 2 years ago; this is doing well at present.  Past Medical History:  Diagnosis Date   Allergy    spring time   Arthritis    Atypical nevus 04/26/2016   mild - right mid back   Blood transfusion 1963   Cancer ([place]) 2019   melanoma face   Clark level II melanoma ([place]) 12/13/2017   left inner cheek   Sleep apnea    cpap   Squamous cell carcinoma in situ of skin 04/26/2016   inner left cheek    Past Surgical History:  Procedure Laterality Date   COLONOSCOPY     I & D EXTREMITY  07/14/2011   Procedure: IRRIGATION AND DEBRIDEMENT EXTREMITY;  Surgeon: Linna Hoff, MD;  Location: [facility];  Service: Orthopedics;  Laterality: Left;   POLYPECTOMY     TENDON REPAIR  07/14/2011   Procedure: TENDON REPAIR;  Surgeon: Linna Hoff, MD;  Location: [facility];  Service: Orthopedics;  Laterality: Left;    Family History  Problem Relation Age of Onset   Diabetes Mother    Heart disease Mother    Frontotemporal dementia Father    Diabetes Sister    Diabetes Brother    Colon cancer Neg Hx    Colon polyps Neg Hx    Esophageal cancer Neg Hx    Stomach cancer Neg Hx    Rectal cancer Neg Hx     Social History   Socioeconomic History   Marital status: Married    Spouse name: Not on file   Number of children: Not on file   Years of education: Not on file   Highest education level: Not on file  Occupational History   Not on file  Tobacco Use   Smoking status: Never   Smokeless tobacco: Never   Vaping Use   Vaping Use: Never used  Substance and Sexual Activity   Alcohol use: Yes    Alcohol/week: 14.0 standard drinks    Types: 14 Glasses of wine per week   Drug use: No   Sexual activity: Yes  Other Topics Concern   Not on file  Social History Narrative   Not on file   Social Determinants of Health   Financial Resource Strain: Not on file  Food Insecurity: Not on file  Transportation Needs: Not on file  Physical Activity: Not on file  Stress: Not on file  Social Connections: Not on file  Intimate Partner Violence: Not on file    ROS Review of Systems  Objective:   Today's Vitals: BP 130/68   Pulse (!) 59   Ht _0  (1.854 m)   Wt 259 lb 12.8 oz (117.8 kg)   SpO2 96%   BMI 34.28 kg/m   Physical Exam  Assessment & Plan:   Problem List Items Addressed This Visit       Cardiovascular and Mediastinum   Hypertension    Blood pressure at goal in office today Can continue with current regimen including losartan 100 mg daily Recommend intermittent blood pressure checks at home Continue to monitor in the office Recommend lifestyle modifications including DASH diet, regular physical activity       Relevant Medications   atorvastatin (LIPITOR) 20 MG tablet   Other Relevant Orders   CBC with Differential/Platelet   Comprehensive metabolic panel   Lipid panel     Genitourinary   BPH (benign prostatic hyperplasia)    Follows with urology, managed with tamsulosin and sildenafil Refill of tamsulosin provided today       Relevant Medications   tamsulosin (FLOMAX) 0.4 MG CAPS capsule     Other   Abnormal blood sugar - Primary    History of prediabetes, but with recent abnormal fasting blood sugar readings Will check hemoglobin A1c to further assess Discussed possible management, particularly if A1c is 6.5% or greater which would likely include titration of metformin to maximally tolerated dose, did briefly discuss injectable GLP-1 receptor agonist  medication as a potential option as well due to benefits of weight loss       Relevant Orders   Hemoglobin A1c   Dyslipidemia    Discussed with patient regarding statin therapy, its risks and benefits, particular related to presence of diabetes if this is found to be the case Would recommend continuing with statin therapy at maximally tolerated dose which at this time appears to be 20 mg Check labs as below  Relevant Medications   atorvastatin (LIPITOR) 20 MG tablet   Other Relevant Orders   Lipid panel   Obesity, Class I, BMI 30-34.9    As above, did discuss injectable GLP-1 agonist medications which can be useful both for diabetes and weight loss We will follow-up on lab work Continue with lifestyle modifications including dietary measures and regular physical activity       Relevant Orders   Comprehensive metabolic panel   Hemoglobin A1c   Other Visit Diagnoses     Type 2 diabetes mellitus with other specified complication, without long-term current use of insulin (HCC)       Relevant Medications   atorvastatin (LIPITOR) 20 MG tablet   metFORMIN (GLUCOPHAGE) 500 MG tablet       Outpatient Encounter Medications as of 11/05/2020  Medication Sig   atorvastatin (LIPITOR) 20 MG tablet Take 20 mg by mouth daily.   losartan (COZAAR) 100 MG tablet TAKE 1 TABLET BY MOUTH ONCE A DAY   zolpidem (AMBIEN) 5 MG tablet TAKE 1/2 TO 1 TABLET BY MOUTH AT BEDTIME AS NEEDED FOR SLEEP   [DISCONTINUED] metFORMIN (GLUCOPHAGE) 500 MG tablet Take 1 tablet (500 mg total) by mouth daily with breakfast.   [DISCONTINUED] tamsulosin (FLOMAX) 0.4 MG CAPS capsule TAKE 1 CAPSULE BY MOUTH EVERY DAY   metFORMIN (GLUCOPHAGE) 500 MG tablet Take 1 tablet (500 mg total) by mouth daily with breakfast.   tamsulosin (FLOMAX) 0.4 MG CAPS capsule Take 1 capsule (0.4 mg total) by mouth daily.   [DISCONTINUED] atorvastatin (LIPITOR) 40 MG tablet Take 1 tablet (40 mg total) by mouth at night. If myalgias  occur, hold or go down to 1/2 tablet (55m) for 1-2 weeks, then restart. (Patient not taking: Reported on 11/05/2020)   [DISCONTINUED] COVID-19 At Home Antigen Test (CARESTART COVID-19 HOME TEST) KIT Use as directed   [DISCONTINUED] COVID-19 mRNA vaccine, Moderna, 100 MCG/0.5ML injection Inject into the muscle.   [DISCONTINUED] sildenafil (REVATIO) 20 MG tablet Take 20 mg by mouth as needed.   [DISCONTINUED] traMADol (ULTRAM) 50 MG tablet Take 2 tablets by mouth three times daily as needed for pain   No facility-administered encounter medications on file as of 11/05/2020.   Spent 45 minutes on this patient encounter, including preparation, chart review, face-to-face counseling with patient and coordination of care, and documentation of encounter  Follow-up: Return in about 8 weeks (around 12/31/2020).   Darrio Bade J De C[place] MD

## 2020-11-05 NOTE — Assessment & Plan Note (Signed)
Follows with urology, managed with tamsulosin and sildenafil Refill of tamsulosin provided today

## 2020-11-05 NOTE — Assessment & Plan Note (Signed)
History of prediabetes, but with recent abnormal fasting blood sugar readings Will check hemoglobin A1c to further assess Discussed possible management, particularly if A1c is 6.5% or greater which would likely include titration of metformin to maximally tolerated dose, did briefly discuss injectable GLP-1 receptor agonist medication as a potential option as well due to benefits of weight loss

## 2020-11-06 LAB — CBC WITH DIFFERENTIAL/PLATELET
Basophils Absolute: 0 10*3/uL (ref 0.0–0.2)
Basos: 1 %
EOS (ABSOLUTE): 0.1 10*3/uL (ref 0.0–0.4)
Eos: 2 %
Hematocrit: 41.8 % (ref 37.5–51.0)
Hemoglobin: 14.2 g/dL (ref 13.0–17.7)
Immature Grans (Abs): 0 10*3/uL (ref 0.0–0.1)
Immature Granulocytes: 0 %
Lymphocytes Absolute: 1.7 10*3/uL (ref 0.7–3.1)
Lymphs: 36 %
MCH: 31.6 pg (ref 26.6–33.0)
MCHC: 34 g/dL (ref 31.5–35.7)
MCV: 93 fL (ref 79–97)
Monocytes Absolute: 0.6 10*3/uL (ref 0.1–0.9)
Monocytes: 13 %
Neutrophils Absolute: 2.2 10*3/uL (ref 1.4–7.0)
Neutrophils: 48 %
Platelets: 214 10*3/uL (ref 150–450)
RBC: 4.5 x10E6/uL (ref 4.14–5.80)
RDW: 12.9 % (ref 11.6–15.4)
WBC: 4.7 10*3/uL (ref 3.4–10.8)

## 2020-11-06 LAB — LIPID PANEL
Chol/HDL Ratio: 2.4 ratio (ref 0.0–5.0)
Cholesterol, Total: 134 mg/dL (ref 100–199)
HDL: 57 mg/dL (ref >39)
LDL Chol Calc (NIH): 66 mg/dL (ref 0–99)
Triglycerides: 46 mg/dL (ref 0–149)
VLDL Cholesterol Cal: 11 mg/dL (ref 5–40)

## 2020-11-06 LAB — COMPREHENSIVE METABOLIC PANEL
ALT: 23 IU/L (ref 0–44)
AST: 26 IU/L (ref 0–40)
Albumin/Globulin Ratio: 2 (ref 1.2–2.2)
Albumin: 4.5 g/dL (ref 3.8–4.8)
Alkaline Phosphatase: 76 IU/L (ref 44–121)
BUN/Creatinine Ratio: 19 (ref 10–24)
BUN: 17 mg/dL (ref 8–27)
Bilirubin Total: 0.9 mg/dL (ref 0.0–1.2)
CO2: 21 mmol/L (ref 20–29)
Calcium: 9.5 mg/dL (ref 8.6–10.2)
Chloride: 101 mmol/L (ref 96–106)
Creatinine, Ser: 0.88 mg/dL (ref 0.76–1.27)
Globulin, Total: 2.2 g/dL (ref 1.5–4.5)
Glucose: 100 mg/dL — ABNORMAL HIGH (ref 65–99)
Potassium: 4.6 mmol/L (ref 3.5–5.2)
Sodium: 136 mmol/L (ref 134–144)
Total Protein: 6.7 g/dL (ref 6.0–8.5)
eGFR: 95 mL/min/{1.73_m2} (ref >59)

## 2020-11-06 LAB — HEMOGLOBIN A1C
Est. average glucose Bld gHb Est-mCnc: 140 mg/dL
Hgb A1c MFr Bld: 6.5 % — ABNORMAL HIGH (ref 4.8–5.6)

## 2020-11-07 ENCOUNTER — Telehealth (HOSPITAL_BASED_OUTPATIENT_CLINIC_OR_DEPARTMENT_OTHER): Payer: Self-pay

## 2020-11-07 NOTE — Telephone Encounter (Signed)
Per DPR left detailed voicemail of lab results and recommendations  Instructed patient to call the office with any questions or if sooner follow up is desired

## 2020-11-07 NOTE — Telephone Encounter (Signed)
-----   Message from [name] [name], MD sent at 11/07/2020  8:38 AM EDT ----- Normal white blood cell and red blood cell counts with normal hemoglobin.  Electrolytes, kidney function and liver function are normal.  Cholesterol panel is normal.  Hemoglobin A1c which measures the blood sugars over the past 3 months is 6.5%.  This finding is consistent with diabetes diagnosis.  We can discuss further at next office visit or if sooner follow-up is desired, can schedule in-office follow-up or virtual visit follow-up sooner as desired.

## 2020-11-12 ENCOUNTER — Ambulatory Visit: Payer: 59 | Admitting: Pharmacist

## 2020-11-12 DIAGNOSIS — I10 Essential (primary) hypertension: Secondary | ICD-10-CM

## 2020-11-12 DIAGNOSIS — E785 Hyperlipidemia, unspecified: Secondary | ICD-10-CM

## 2020-11-12 DIAGNOSIS — E1165 Type 2 diabetes mellitus with hyperglycemia: Secondary | ICD-10-CM

## 2020-11-12 NOTE — Chronic Care Management (AMB) (Signed)
**Note Lance-Identified via Obfuscation** Employee Health Plan Care Management  Pharmacy Note  11/12/2020 Name: Lance Davis MRN: [id number] DOB: [birth year]  Subjective: Lance Davis is a 65 y.o. year old male who is a primary care patient of Lance Davis, Lance Reveal, MD.  I connected with  Lance Davis on 10/15/20 by a video enabled telemedicine application and verified that I am speaking with the correct person using two identifiers.   I discussed the limitations of evaluation and management by telemedicine. The patient expressed understanding and agreed to proceed.   Assessment:  Review of patient status, including review of consultants reports, laboratory and other test data, was performed as part of comprehensive evaluation and provision of chronic care management services.   SDOH (Social Determinants of Health) assessments and interventions performed:  SDOH Interventions    Flowsheet Row Most Recent Value  SDOH Interventions   Financial Strain Interventions Intervention Not Indicated  Physical Activity Interventions Intervention Not Indicated        Objective:  Lab Results  Component Value Date   CREATININE 0.88 11/05/2020    Lab Results  Component Value Date   HGBA1C 6.5 (H) 11/05/2020       Component Value Date/Time   CHOL 134 11/05/2020 0925   TRIG 46 11/05/2020 0925   HDL 57 11/05/2020 0925   CHOLHDL 2.4 11/05/2020 0925   LDLCALC 66 11/05/2020 0925     Clinical ASCVD: No  The 10-year ASCVD risk score Lance Bussing DC Jr., et al., 2013) is: 21.4%   Values used to calculate the score:     Age: 4 years     Sex: Male     Is Non-Hispanic African American: No     Diabetic: Yes     Tobacco smoker: No     Systolic Blood Pressure: [id number] mmHg     Is BP treated: Yes     HDL Cholesterol: 57 mg/dL     Total Cholesterol: 134 mg/dL      BP Readings from Last 3 Encounters:  11/05/20 130/68  05/17/19 (!) 153/72  02/15/18 114/71    Care Plan  No Known Allergies  Medications Reviewed Today      Reviewed by Lance Hollingshead, RPH-CPP (Pharmacist) on 11/12/20 at [phone]  Med List Status: <None>   Medication Order Taking? Sig Documenting Provider Last Dose Status Informant  atorvastatin (LIPITOR) 20 MG tablet [id number] Yes Take 20 mg by mouth daily. [provider] Taking Active   losartan (COZAAR) 100 MG tablet [id number] Yes TAKE 1 TABLET BY MOUTH ONCE A DAY [name], DO Taking Active   metFORMIN (GLUCOPHAGE) 500 MG tablet [id number] Yes Take 1 tablet (500 mg total) by mouth daily with breakfast. Lance Davis, [name], MD Taking Active   tamsulosin [facility]) 0.4 MG CAPS capsule [id number] Yes Take 1 capsule (0.4 mg total) by mouth daily. Lance Davis, [name], MD Taking Active     Discontinued 05/17/19 1751   zolpidem (AMBIEN) 5 MG tablet [id number] Yes TAKE 1/2 TO 1 TABLET BY MOUTH AT BEDTIME AS NEEDED FOR SLEEP  Taking Active             Patient Active Problem List   Diagnosis Date Noted   Abnormal blood sugar 11/05/2020   Hypertension 11/05/2020   Dyslipidemia 11/05/2020   BPH (benign prostatic hyperplasia) 11/05/2020   Obesity, Class I, BMI 30-34.9 11/05/2020   Partial tear of left Achilles tendon 12/05/2019   Osteoarthritis of hip 02/10/2017   Groin pain, right 01/13/2017  Conditions to be addressed/monitored: HTN, HLD, and DMII  Care Plan : Medication Management  Updates made by Lance Hollingshead, RPH-CPP since 11/12/2020 12:00 AM     Problem: Diabetes, HTN, HLD      Long-Range Goal: Disease Prevention Progression   Start Date: 10/15/2020  This Visit's Progress: On track  Recent Progress: On track  Priority: High  Note:   Current Barriers:  Unable to achieve control of blood sugar   Pharmacist Clinical Goal(s):  Over the next 90 days, patient will achieve control of blood sugars as evidenced by A1c  through collaboration with PharmD and provider.   Interventions: Inter-disciplinary care team collaboration (see longitudinal plan of  care) Comprehensive medication review performed; medication list updated in electronic medical record  Diabetes: New diagnosis, most recent A1c 6.5%; Current regimen: metformin 500 mg daily Does report a 10 lb weight loss in the last month due to lifestyle modification. Current meal patterns: breakfast: cereal, boiled eggs, avoid white bread; lunch: hamburger patty (avoids bread); dinner: various things, focus on lean proteins, vegetables, small portion sizes of carbs; drinks: water, 0-4 glasses of wine daily Current exercise: walking 2 miles daily, [employer], plans to get back into a routine with that, gym Reviewed goal A1c, goal of treatment, long term macro and microvascular complications. Discussed pharmacotherapy options moving forward. Discussed 1) continued lifestyle modification vs 2) titration of metformin vs 3) addition of GLP1. Counseled on GLP1 agonists, including mechanism of action, side effects, and benefits. No documented personal or family history of medullary thyroid cancer, personal history of pancreatitis or gallbladder disease. He reports that a family member was talking to him about Mounjaro. Discussed Ozempic and Mounjaro, and current Insurance underwriter. He plans to contemplate and consider discussing Mounjaro with PCP at next visit. Likely could allow for discontinuation of metformin. Current manufacture savings card for [facility] would reduce price to $25/month.   Hypertension: Controlled per patient report; current treatment: losartan 100 mg daily;  Current home readings: 110-120s/60-70s Educated on goal BP <130/80. Educated on benefit of RAAS agents in diabetes.  Recommended to continue current regimen at this time. Discussed importance of continuing to monitor BP if pharmacotherapy to help with weight loss is utilized and subsequent weight loss improves BP. He verbalizes understanding.   Hyperlipidemia and primary prevention: Controlled; current treatment: atorvastatin 20 mg  daily Previous medications: atorvastatin 40 mg daily, though reported muscle aches/pains to PCP and they reduced dose  Reports a family history of premature cardiovascular disease (mother required a CABG in her 83s) Discussed recommendation for statin therapy, likely with a more stringent goal of LDL <70, given DM + risk factors of HTN and family history of premature ASCVD is appropriate per AACE/ACE guidelines. Discussed that LDL was right at goal on atorvastatin 40 mg daily, so 20 mg daily may not control to goal LDL <70. We also discussed differences in statin lipophilicity and that rosuvastatin may be better tolerated, if atorvastatin 20 mg does not control LDL. Patient notes he may try escalating back to atorvastatin 40 mg moving forward, and if unable to tolerate, will discuss change to rosuvastatin moving forward with PCP  BPH: Appropriately managed; current regimen: tamsulosin 0.4 mg daily, sildenafil 20 mg PRN Will discuss goals of care and appropriate management moving forward.   Insomnia: Appropriately managed; current regimen: zolpidem 5 mg - 1/2-1 tab QPM PRN  Will discuss goals of care and appropriate management moving forward.    Patient Goals/Self-Care Activities Over the next 30 days, patient  will:  - take medications as prescribed check glucose 1-2 times daily, document, and provide at future appointments check blood pressure periodically, document, and provide at future appointments target a minimum of 150 minutes of moderate intensity exercise weekly engage in dietary modifications by reducing carbohydrate intake  Follow Up Plan: Telephone follow up appointment with care management team member scheduled for: ~ 8 weeks      Medication Assistance:  None required.  Patient affirms current coverage meets needs.  Follow Up:  Patient agrees to Care Plan and Follow-up.  Plan: Telephone follow up appointment with care management team member scheduled for:  ~ 8  weeks  Catie Darnelle Maffucci, PharmD, [place], [place] Clinical Pharmacist [employer] at [employer] [phone]

## 2020-11-12 NOTE — Patient Instructions (Signed)
Visit Information   Goals Addressed               This Visit's Progress     Patient Stated     Medication Monitoring (pt-stated)        Patient Goals/Self-Care Activities Over the next 30 days, patient will:  - take medications as prescribed check glucose 1-2 times daily, document, and provide at future appointments check blood pressure periodically, document, and provide at future appointments target a minimum of 150 minutes of moderate intensity exercise weekly engage in dietary modifications by reducing carbohydrate intake        Patient verbalizes understanding of instructions provided today and agrees to view in [place].   Plan: Telephone follow up appointment with care management team member scheduled for:  ~ 8 weeks  Catie Darnelle Maffucci, PharmD, [place], [place] Clinical Pharmacist [employer] at [employer] [phone]

## 2020-11-14 ENCOUNTER — Other Ambulatory Visit (HOSPITAL_COMMUNITY): Payer: Self-pay

## 2020-11-14 MED FILL — Losartan Potassium Tab 100 MG: ORAL | 90 days supply | Qty: 90 | Fill #2 | Status: CN

## 2020-11-14 NOTE — Addendum Note (Signed)
Addended by: Campbell Riches on: 11/14/2020 04:11 PM   Modules accepted: Orders

## 2020-11-24 ENCOUNTER — Other Ambulatory Visit (HOSPITAL_COMMUNITY): Payer: Self-pay

## 2020-11-24 MED FILL — Losartan Potassium Tab 100 MG: ORAL | 90 days supply | Qty: 90 | Fill #2 | Status: AC

## 2020-11-26 DIAGNOSIS — G4733 Obstructive sleep apnea (adult) (pediatric): Secondary | ICD-10-CM | POA: Diagnosis not present

## 2021-01-05 ENCOUNTER — Encounter (HOSPITAL_BASED_OUTPATIENT_CLINIC_OR_DEPARTMENT_OTHER): Payer: Self-pay | Admitting: Family Medicine

## 2021-01-05 ENCOUNTER — Other Ambulatory Visit: Payer: Self-pay

## 2021-01-05 ENCOUNTER — Ambulatory Visit (HOSPITAL_BASED_OUTPATIENT_CLINIC_OR_DEPARTMENT_OTHER): Payer: 59 | Admitting: Family Medicine

## 2021-01-05 VITALS — BP 120/78 | HR 55 | Ht 73.0 in | Wt 254.8 lb

## 2021-01-05 DIAGNOSIS — I7121 Aneurysm of the ascending aorta, without rupture: Secondary | ICD-10-CM | POA: Diagnosis not present

## 2021-01-05 DIAGNOSIS — I10 Essential (primary) hypertension: Secondary | ICD-10-CM

## 2021-01-05 DIAGNOSIS — E119 Type 2 diabetes mellitus without complications: Secondary | ICD-10-CM | POA: Insufficient documentation

## 2021-01-05 NOTE — Assessment & Plan Note (Signed)
New diagnosis with recent A1c of 6.5% Discussed diagnosis, management, recommendations He is currently on metformin, recommend further titrating medication to 500 mg twice daily and monitoring side effects Discussed additional management options including lifestyle modifications, additional pharmacotherapy, specifically GLP-1 receptor agonist which can help with control of blood pressure as well as promoting weight loss Discussed option of referral to nutritionist/diabetic educator.  Patient reports that he did have a virtual visit with diabetic educator.  Not interested in additional referral at this time Plan for recheck of A1c at follow-up visit in 2 to 3 months Plan to complete microalbumin/creatinine ratio at next visit as well as foot exam

## 2021-01-05 NOTE — Patient Instructions (Signed)
  Medication Instructions:  Your physician recommends that you continue on your current medications as directed. Please refer to the Current Medication list given to you today. --If you need a refill on any your medications before your next appointment, please call your pharmacy first. If no refills are authorized on file call the office.--  Follow-Up: Your next appointment:   Your physician recommends that you schedule a follow-up appointment in: 2-3 MONTHS with Dr. [name]  You will receive a text message or e-mail with a link to a survey about your care and experience with [place] today! We would greatly appreciate your feedback!   Thanks for letting us be apart of your health journey!!  Primary Care and Sports Medicine   Dr. Arlina Robes [place]   We encourage you to activate your patient portal called "MyChart".  Sign up information is provided on this After Visit Summary.  MyChart is used to connect with patients for Virtual Visits (Telemedicine).  Patients are able to view lab/test results, encounter notes, upcoming appointments, etc.  Non-urgent messages can be sent to your provider as well. To learn more about what you can do with MyChart, please visit --  [email or web address].

## 2021-01-05 NOTE — Progress Notes (Signed)
    Procedures performed today:    None.  Independent interpretation of notes and tests performed by another provider:   None.  Brief History, Exam, Impression, and Recommendations:    BP 120/78   Pulse (!) 55   Ht 6\' 1"  (1.854 m)   Wt 254 lb 12.8 oz (115.6 kg)   SpO2 97%   BMI 33.62 kg/m   Hypertension Blood pressure at goal in office today.  Denies any issues with chest pain, lightheadedness or dizziness Continue with losartan 100 mg daily Recommend continuing to monitor blood pressure at home intermittently Recommend DASH diet, regular physical activity  Thoracic ascending aortic aneurysm Reviewed scan for CAC scoring completed November 2021 Findings on imaging included thoracic ascending aorta aneurysm measuring 4.1 cm.  Recommendations were for annual follow-up of this aneurysm Discussed with patient in the office today, patient is interested in referral to cardiology for further review related to aneurysm, blood pressure management, statin management Feel that referral is reasonable if desired Patient will look into who he would like to see and will let us know once he decides  Diabetes [facility]) New diagnosis with recent A1c of 6.5% Discussed diagnosis, management, recommendations He is currently on metformin, recommend further titrating medication to 500 mg twice daily and monitoring side effects Discussed additional management options including lifestyle modifications, additional pharmacotherapy, specifically GLP-1 receptor agonist which can help with control of blood pressure as well as promoting weight loss Discussed option of referral to nutritionist/diabetic educator.  Patient reports that he did have a virtual visit with diabetic educator.  Not interested in additional referral at this time Plan for recheck of A1c at follow-up visit in 2 to 3 months Plan to complete microalbumin/creatinine ratio at next visit as well as foot  exam   ___________________________________________ Gustav Knueppel [name], MD, ABFM, [facility] Primary Care and [place]

## 2021-01-05 NOTE — Assessment & Plan Note (Signed)
Reviewed scan for CAC scoring completed November 2021 Findings on imaging included thoracic ascending aorta aneurysm measuring 4.1 cm.  Recommendations were for annual follow-up of this aneurysm Discussed with patient in the office today, patient is interested in referral to cardiology for further review related to aneurysm, blood pressure management, statin management Feel that referral is reasonable if desired Patient will look into who he would like to see and will let us know once he decides

## 2021-01-05 NOTE — Assessment & Plan Note (Signed)
Blood pressure at goal in office today.  Denies any issues with chest pain, lightheadedness or dizziness Continue with losartan 100 mg daily Recommend continuing to monitor blood pressure at home intermittently Recommend DASH diet, regular physical activity

## 2021-01-06 DIAGNOSIS — Z23 Encounter for immunization: Secondary | ICD-10-CM | POA: Diagnosis not present

## 2021-01-06 DIAGNOSIS — D1801 Hemangioma of skin and subcutaneous tissue: Secondary | ICD-10-CM | POA: Diagnosis not present

## 2021-01-06 DIAGNOSIS — L570 Actinic keratosis: Secondary | ICD-10-CM | POA: Diagnosis not present

## 2021-01-06 DIAGNOSIS — Z85820 Personal history of malignant melanoma of skin: Secondary | ICD-10-CM | POA: Diagnosis not present

## 2021-01-06 DIAGNOSIS — L578 Other skin changes due to chronic exposure to nonionizing radiation: Secondary | ICD-10-CM | POA: Diagnosis not present

## 2021-01-06 DIAGNOSIS — L821 Other seborrheic keratosis: Secondary | ICD-10-CM | POA: Diagnosis not present

## 2021-01-06 DIAGNOSIS — L814 Other melanin hyperpigmentation: Secondary | ICD-10-CM | POA: Diagnosis not present

## 2021-01-14 ENCOUNTER — Ambulatory Visit: Payer: 59 | Admitting: Pharmacist

## 2021-01-14 VITALS — Wt 247.0 lb

## 2021-01-14 DIAGNOSIS — I10 Essential (primary) hypertension: Secondary | ICD-10-CM

## 2021-01-14 DIAGNOSIS — E119 Type 2 diabetes mellitus without complications: Secondary | ICD-10-CM

## 2021-01-14 DIAGNOSIS — E785 Hyperlipidemia, unspecified: Secondary | ICD-10-CM

## 2021-01-14 NOTE — Chronic Care Management (AMB) (Signed)
Care Management   Pharmacy Note  01/14/2021 Name: Lance Davis MRN: [id number] DOB: [birth year]  Subjective: Lance Davis is a 65 y.o. year old male who is a primary care patient of [name], [name], MD on our employee health plan   I attempted to connect with the patient for video visit, but services did not work. Completed a telephone visit with him today  Objective:  Lab Results  Component Value Date   CREATININE 0.88 11/05/2020    Lab Results  Component Value Date   HGBA1C 6.5 (H) 11/05/2020       Component Value Date/Time   CHOL 134 11/05/2020 0925   TRIG 46 11/05/2020 0925   HDL 57 11/05/2020 0925   CHOLHDL 2.4 11/05/2020 0925   LDLCALC 66 11/05/2020 0925    BP Readings from Last 3 Encounters:  01/05/21 120/78  11/05/20 130/68  05/17/19 (!) 153/72    Care Plan  No Known Allergies  Medications Reviewed Today     Reviewed by De Hollingshead, RPH-CPP (Pharmacist) on 01/14/21 at [place] List Status: <None>   Medication Order Taking? Sig Documenting Provider Last Dose Status Informant  atorvastatin (LIPITOR) 20 MG tablet [id number] Yes Take 20 mg by mouth daily. [provider] Taking Active   losartan (COZAAR) 100 MG tablet [id number] Yes TAKE 1 TABLET BY MOUTH ONCE A DAY [name], DO Taking Active   metFORMIN (GLUCOPHAGE) 500 MG tablet [id number] Yes Take 1 tablet (500 mg total) by mouth daily with breakfast. [name], [name], MD Taking Active            Med Note ([place] Jan 14, 2021  8:19 AM) 500 mg twice daily  tamsulosin (FLOMAX) 0.4 MG CAPS capsule [id number] Yes Take 1 capsule (0.4 mg total) by mouth daily. [name], [name], MD Taking Active             Care Plan : Medication Management  Updates made by De Hollingshead, RPH-CPP since 01/14/2021 12:00 AM     Problem: Diabetes, HTN, HLD      Long-Range Goal: Disease Prevention Progression   Start Date: 10/15/2020  This Visit's Progress: On  track  Recent Progress: On track  Priority: High  Note:   Current Barriers:  Unable to achieve control of blood sugar   Pharmacist Clinical Goal(s):  Over the next 90 days, patient will achieve control of blood sugars as evidenced by A1c  through collaboration with PharmD and provider.   Interventions: Inter-disciplinary care team collaboration (see longitudinal plan of care) Comprehensive medication review performed; medication list updated in electronic medical record  Health Maintenance   Yearly diabetic eye exam: due - educated on this today. Encouraged to have eye doctor send results to PCP when available Yearly diabetic foot exam: due - educated on this today. PCP plans to complete next viist Urine microalbumin: due - educated on this today Yearly influenza vaccination: due - recommended yearly dose Td/Tdap vaccination: up to date Pneumonia vaccination: due - recommend PCV20 COVID vaccinations: up to date - added bivalent vaccination today Shingrix vaccinations: up to date Colonoscopy: up to date  Diabetes: Controlled, most recent A1c 6.5%; Current regimen: metformin 500 mg twice daily  Denies any GI upset, loose stools since increasing metformin dose.  Current meal patterns: cut out white carbohydrates; using whole grain carbohydrates when having pasta or other carbohydrates; Current exercise: walking 2 miles daily, golf regularly; discussed target of 150 minutes  weekly Reviewed goal A1c, goal of treatment, long term macro and microvascular complications. Encouraged to continue current regimen at this time. He requests an updated prescription for metformin 500 mg twice daily. Will collaborate with PCP.  Discussed Libre 3 and use of CGM. Patient declines at this time, but will consider moving forward.   Hypertension: Controlled; current treatment: losartan 100 mg daily;  Current home readings: 110-120s/60 Denies any concerns with s/sx of hypotension, including  lightheadedness  Previously educated on goal BP <130/80. Educated on benefit of RAAS agents in diabetes.  Recommended to continue current regimen at this time.   Hyperlipidemia and primary prevention: Controlled; current treatment: atorvastatin 20 mg daily - though he reports he would like to try to titrate back up to atorvastatin 40 mg Previous medications: atorvastatin 40 mg daily, though reported muscle aches/pains to PCP and they reduced dose  Reports a family history of premature cardiovascular disease (mother required a CABG in her 35s) Discussed he could increase back to 40 mg daily or try alternating 20 mg and 40 mg for a few weeks. He requests a refill on atorvastatin 40 mg daily. Will collaborate w/ PCP.   Patient Goals/Self-Care Activities Over the next 30 days, patient will:  - take medications as prescribed check glucose 1-2 times daily, document, and provide at future appointments check blood pressure periodically, document, and provide at future appointments target a minimum of 150 minutes of moderate intensity exercise weekly engage in dietary modifications by reducing carbohydrate intake  Follow Up Plan: Telephone follow up appointment with care management team member scheduled for: ~ 4 months       Plan: Video visit scheduled in 4 months  Catie Darnelle Maffucci, PharmD, [place], CPP Clinical Pharmacist [employer] at [place]

## 2021-01-14 NOTE — Patient Instructions (Addendum)
Visit Information   Goals Addressed               This Visit's Progress     Patient Stated     Medication Monitoring (pt-stated)        Patient Goals/Self-Care Activities Over the next 90 days, patient will:  - take medications as prescribed check glucose periodically daily, document, and provide at future appointments check blood pressure periodically, document, and provide at future appointments target a minimum of 150 minutes of moderate intensity exercise weekly engage in dietary modifications by reducing carbohydrate intake       Plan: Video visit scheduled in 4 months   Catie Darnelle Maffucci, PharmD, [place], CPP Clinical Pharmacist [employer] at [place]

## 2021-01-20 ENCOUNTER — Other Ambulatory Visit (HOSPITAL_COMMUNITY): Payer: Self-pay

## 2021-01-20 ENCOUNTER — Other Ambulatory Visit (HOSPITAL_BASED_OUTPATIENT_CLINIC_OR_DEPARTMENT_OTHER): Payer: Self-pay | Admitting: Family Medicine

## 2021-01-20 DIAGNOSIS — E1169 Type 2 diabetes mellitus with other specified complication: Secondary | ICD-10-CM

## 2021-01-20 MED ORDER — METFORMIN HCL 500 MG PO TABS
500.0000 mg | ORAL_TABLET | Freq: Two times a day (BID) | ORAL | 1 refills | Status: DC
Start: 2021-01-20 — End: 2021-03-16
  Filled 2021-01-20: qty 180, 90d supply, fill #0

## 2021-01-20 MED ORDER — ATORVASTATIN CALCIUM 40 MG PO TABS
40.0000 mg | ORAL_TABLET | Freq: Every day | ORAL | 1 refills | Status: DC
  Filled 2021-01-20: qty 90, 90d supply, fill #0

## 2021-02-05 ENCOUNTER — Ambulatory Visit
Admission: RE | Admit: 2021-02-05 | Discharge: 2021-02-05 | Disposition: A | Discharge status: Home / Self Care | Payer: 59 | Source: Ambulatory Visit | Attending: Internal Medicine | Admitting: Internal Medicine

## 2021-02-05 ENCOUNTER — Other Ambulatory Visit (HOSPITAL_COMMUNITY): Payer: Self-pay

## 2021-02-05 DIAGNOSIS — I719 Aortic aneurysm of unspecified site, without rupture: Secondary | ICD-10-CM

## 2021-02-05 DIAGNOSIS — I712 Thoracic aortic aneurysm, without rupture, unspecified: Secondary | ICD-10-CM | POA: Diagnosis not present

## 2021-02-05 DIAGNOSIS — I700 Atherosclerosis of aorta: Secondary | ICD-10-CM

## 2021-02-05 MED ORDER — IOPAMIDOL (ISOVUE-370) INJECTION 76%
75.0000 mL | Freq: Once | INTRAVENOUS | Status: AC | PRN
  Administered 2021-02-05: 75 mL via INTRAVENOUS

## 2021-02-05 MED ORDER — IOPAMIDOL (ISOVUE-370) INJECTION 76%: 75.0000 mL | Freq: Once | INTRAVENOUS | Status: DC | PRN

## 2021-02-17 ENCOUNTER — Other Ambulatory Visit (HOSPITAL_BASED_OUTPATIENT_CLINIC_OR_DEPARTMENT_OTHER): Payer: Self-pay

## 2021-02-17 MED ORDER — INFLUENZA VAC A&B SA ADJ QUAD 0.5 ML IM PRSY
PREFILLED_SYRINGE | INTRAMUSCULAR | 0 refills | Status: DC
  Filled 2021-02-17: qty 0.5, 1d supply, fill #0

## 2021-02-24 ENCOUNTER — Other Ambulatory Visit (HOSPITAL_COMMUNITY): Payer: Self-pay

## 2021-02-24 MED FILL — Losartan Potassium Tab 100 MG: ORAL | 90 days supply | Qty: 90 | Fill #3 | Status: AC

## 2021-03-16 ENCOUNTER — Encounter (HOSPITAL_BASED_OUTPATIENT_CLINIC_OR_DEPARTMENT_OTHER): Payer: Self-pay | Admitting: Family Medicine

## 2021-03-16 ENCOUNTER — Other Ambulatory Visit (HOSPITAL_COMMUNITY): Payer: Self-pay

## 2021-03-16 DIAGNOSIS — E1169 Type 2 diabetes mellitus with other specified complication: Secondary | ICD-10-CM

## 2021-03-16 MED ORDER — METFORMIN HCL 1000 MG PO TABS
1000.0000 mg | ORAL_TABLET | Freq: Two times a day (BID) | ORAL | 0 refills | Status: DC
  Filled 2021-03-16: qty 180, 90d supply, fill #0

## 2021-04-07 ENCOUNTER — Other Ambulatory Visit: Payer: Self-pay

## 2021-04-07 ENCOUNTER — Other Ambulatory Visit (HOSPITAL_COMMUNITY): Payer: Self-pay

## 2021-04-07 ENCOUNTER — Encounter (HOSPITAL_BASED_OUTPATIENT_CLINIC_OR_DEPARTMENT_OTHER): Payer: Self-pay | Admitting: Family Medicine

## 2021-04-07 ENCOUNTER — Ambulatory Visit (INDEPENDENT_AMBULATORY_CARE_PROVIDER_SITE_OTHER): Payer: Medicare HMO | Admitting: Family Medicine

## 2021-04-07 VITALS — BP 120/74 | HR 69 | Ht 74.0 in | Wt 255.0 lb

## 2021-04-07 DIAGNOSIS — E119 Type 2 diabetes mellitus without complications: Secondary | ICD-10-CM

## 2021-04-07 DIAGNOSIS — I7121 Aneurysm of the ascending aorta, without rupture: Secondary | ICD-10-CM

## 2021-04-07 LAB — POCT UA - MICROALBUMIN
Albumin/Creatinine Ratio, Urine, POC: <30
Creatinine, POC: 200 mg/dL
Microalbumin Ur, POC: 10 mg/L

## 2021-04-07 LAB — HEMOGLOBIN A1C
Est. average glucose Bld gHb Est-mCnc: 143 mg/dL
Hgb A1c MFr Bld: 6.6 % — ABNORMAL HIGH (ref 4.8–5.6)

## 2021-04-07 MED ORDER — TIRZEPATIDE 2.5 MG/0.5ML ~~LOC~~ SOAJ
2.5000 mg | SUBCUTANEOUS | 0 refills | Status: DC
  Filled 2021-04-07: qty 2, 28d supply, fill #0

## 2021-04-07 NOTE — Assessment & Plan Note (Signed)
Had CTA chest completed in November 2022.  Findings showed no progression of thoracic ascending aortic aneurysm with stable diameter measured. Discussed with patient today, still interested in referral to cardiology, however he does not recall name of cardiologist that he has been recommended previously He will contact her office with name of provider who he would like to establish with Recommendation for monitoring remains annual follow-up with CTA or MRA

## 2021-04-07 NOTE — Progress Notes (Signed)
° ° °  Procedures performed today:    None.  Independent interpretation of notes and tests performed by another provider:   None.  Brief History, Exam, Impression, and Recommendations:    BP 120/74    Pulse 69    Ht 6\' 2"  (1.88 m)    Wt 255 lb (115.7 kg)    SpO2 98%    BMI 32.74 kg/m   Diabetes (HCC) Has been taking metformin, was able to titrate to 2000 mg twice daily dosing with only occasional GI symptoms Patient has looked into use of GLP-1 receptor agonist/GIP options and would be interested in proceeding with this.  Discussed risks and benefits associated with use of medication such as Medrol or Trulicity.  Patient would like to proceed with [facility] Discussed typical dosage, titration schedule We will start with 2.5 mg once weekly of Manganaro with plan for follow-up in 1 month to monitor progress with this If tolerating well, consider titration to 5 mg dosing Completed nephropathy screening today as well as neuropathy screening A1c to be checked today  Thoracic ascending aortic aneurysm Had CTA chest completed in November 2022.  Findings showed no progression of thoracic ascending aortic aneurysm with stable diameter measured. Discussed with patient today, still interested in referral to cardiology, however he does not recall name of cardiologist that he has been recommended previously He will contact her office with name of provider who he would like to establish with Recommendation for monitoring remains annual follow-up with CTA or MRA  Plan for follow-up in 1 month to monitor progress with Mounjaro   ___________________________________________ Suhayb Anzalone [name], MD, ABFM, [facility] Primary Care and [place]

## 2021-04-07 NOTE — Patient Instructions (Signed)
°  Medication Instructions:  Your physician has recommended you make the following change in your medication:  -- START Mounjoro 2.5 mg - Inject 2.5 mg once weekly into the skin for 4 weeks --If you need a refill on any your medications before your next appointment, please call your pharmacy first. If no refills are authorized on file call the office.-- Lab Work: Your physician has recommended that you have lab work today: Urine Micro and A1C If you have labs (blood work) drawn today and your tests are completely normal, you will receive your results via [place] a phone call from our staff.  Please ensure you check your voicemail in the event that you authorized detailed messages to be left on a delegated number. If you have any lab test that is abnormal or we need to change your treatment, we will call you to review the results.  Follow-Up: Your next appointment:   Your physician recommends that you schedule a follow-up appointment in: 16 WEEKS with Dr. [name]  You will receive a text message or e-mail with a link to a survey about your care and experience with [place] today! We would greatly appreciate your feedback!   Thanks for letting us be apart of your health journey!!  Primary Care and Sports Medicine   Dr. Arlina Robes [place]   We encourage you to activate your patient portal called "MyChart".  Sign up information is provided on this After Visit Summary.  MyChart is used to connect with patients for Virtual Visits (Telemedicine).  Patients are able to view lab/test results, encounter notes, upcoming appointments, etc.  Non-urgent messages can be sent to your provider as well. To learn more about what you can do with MyChart, please visit --  [email or web address].

## 2021-04-07 NOTE — Assessment & Plan Note (Addendum)
Has been taking metformin, was able to titrate to 2000 mg twice daily dosing with only occasional GI symptoms Patient has looked into use of GLP-1 receptor agonist/GIP options and would be interested in proceeding with this.  Discussed risks and benefits associated with use of medication such as Mounjaro or Trulicity.  Patient would like to proceed with [facility] Discussed typical dosage, titration schedule We will start with 2.5 mg once weekly of Manganaro with plan for follow-up in 1 month to monitor progress with this If tolerating well, consider titration to 5 mg dosing Completed nephropathy screening today as well as neuropathy screening A1c to be checked today

## 2021-04-07 NOTE — Addendum Note (Signed)
Addended by: Bobby Rumpf C on: 04/07/2021 11:47 AM   Modules accepted: Orders

## 2021-04-13 DIAGNOSIS — L820 Inflamed seborrheic keratosis: Secondary | ICD-10-CM | POA: Diagnosis not present

## 2021-04-13 DIAGNOSIS — L821 Other seborrheic keratosis: Secondary | ICD-10-CM | POA: Diagnosis not present

## 2021-04-13 DIAGNOSIS — H02403 Unspecified ptosis of bilateral eyelids: Secondary | ICD-10-CM | POA: Diagnosis not present

## 2021-04-13 DIAGNOSIS — Z23 Encounter for immunization: Secondary | ICD-10-CM | POA: Diagnosis not present

## 2021-04-16 DIAGNOSIS — H0011 Chalazion right upper eyelid: Secondary | ICD-10-CM | POA: Diagnosis not present

## 2021-04-16 DIAGNOSIS — H52223 Regular astigmatism, bilateral: Secondary | ICD-10-CM | POA: Diagnosis not present

## 2021-04-16 DIAGNOSIS — H5203 Hypermetropia, bilateral: Secondary | ICD-10-CM | POA: Diagnosis not present

## 2021-04-16 DIAGNOSIS — H524 Presbyopia: Secondary | ICD-10-CM | POA: Diagnosis not present

## 2021-04-26 ENCOUNTER — Encounter (HOSPITAL_BASED_OUTPATIENT_CLINIC_OR_DEPARTMENT_OTHER): Payer: Self-pay | Admitting: Family Medicine

## 2021-04-26 ENCOUNTER — Other Ambulatory Visit (HOSPITAL_BASED_OUTPATIENT_CLINIC_OR_DEPARTMENT_OTHER): Payer: Self-pay | Admitting: Family Medicine

## 2021-04-27 ENCOUNTER — Encounter (HOSPITAL_BASED_OUTPATIENT_CLINIC_OR_DEPARTMENT_OTHER): Payer: Self-pay | Admitting: Family Medicine

## 2021-04-27 ENCOUNTER — Telehealth (HOSPITAL_BASED_OUTPATIENT_CLINIC_OR_DEPARTMENT_OTHER): Payer: Self-pay | Admitting: Family Medicine

## 2021-04-27 ENCOUNTER — Ambulatory Visit (INDEPENDENT_AMBULATORY_CARE_PROVIDER_SITE_OTHER): Payer: Medicare HMO | Admitting: Family Medicine

## 2021-04-27 ENCOUNTER — Other Ambulatory Visit: Payer: Self-pay

## 2021-04-27 ENCOUNTER — Other Ambulatory Visit (HOSPITAL_COMMUNITY): Payer: Self-pay

## 2021-04-27 ENCOUNTER — Other Ambulatory Visit (HOSPITAL_BASED_OUTPATIENT_CLINIC_OR_DEPARTMENT_OTHER): Payer: Self-pay

## 2021-04-27 VITALS — BP 122/80 | HR 57 | Ht 74.25 in | Wt 255.0 lb

## 2021-04-27 DIAGNOSIS — E119 Type 2 diabetes mellitus without complications: Secondary | ICD-10-CM | POA: Diagnosis not present

## 2021-04-27 DIAGNOSIS — N400 Enlarged prostate without lower urinary tract symptoms: Secondary | ICD-10-CM

## 2021-04-27 MED ORDER — ATORVASTATIN CALCIUM 40 MG PO TABS
40.0000 mg | ORAL_TABLET | Freq: Every day | ORAL | 0 refills | Status: DC
  Filled 2021-04-27: qty 90, 90d supply, fill #0

## 2021-04-27 MED ORDER — TIRZEPATIDE 5 MG/0.5ML ~~LOC~~ SOAJ
5.0000 mg | SUBCUTANEOUS | 0 refills | Status: DC
  Filled 2021-04-27 (×2): qty 2, 28d supply, fill #0

## 2021-04-27 MED ORDER — TAMSULOSIN HCL 0.4 MG PO CAPS
0.4000 mg | ORAL_CAPSULE | Freq: Every day | ORAL | 0 refills | Status: DC
  Filled 2021-04-27: qty 90, 90d supply, fill #0

## 2021-04-27 MED ORDER — LOSARTAN POTASSIUM 100 MG PO TABS
100.0000 mg | ORAL_TABLET | Freq: Every day | ORAL | 0 refills | Status: DC
  Filled 2021-04-27: qty 90, 90d supply, fill #0

## 2021-04-27 NOTE — Progress Notes (Signed)
° ° °  Procedures performed today:    None.  Independent interpretation of notes and tests performed by another provider:   None.  Brief History, Exam, Impression, and Recommendations:    BP 122/80    Pulse (!) 57    Ht 6' 2.25" (1.886 m)    Wt 255 lb (115.7 kg)    SpO2 97%    BMI 32.52 kg/m   Diabetes ([place]) Patient reports that he has been tolerating Mounjaro well, currently taking 2.5 mg dose.  Denies any GI symptoms such as nausea, vomiting.  Also continues with metformin at this time. Discussed that since he has been tolerating medication well, can proceed with titration to 5 mg dose after 4 weeks of being at 2.5 mg dose.  Thus far has taken 3 doses of Mounjaro, prescription sent to pharmacy on file Plan for follow-up in about 1 month to monitor progress with 5 mg dosing.  If tolerating, likely continue with 5 mg dose and plan to recheck A1c to determine if further dose titration is warranted  BPH (benign prostatic hyperplasia) Feels that symptoms have been well controlled with current medications.  Requesting refill of tamsulosin today, refill provided   ___________________________________________ Lance Kynard [name], MD, ABFM, [facility] Primary Care and [place]

## 2021-04-27 NOTE — Assessment & Plan Note (Signed)
Feels that symptoms have been well controlled with current medications.  Requesting refill of tamsulosin today, refill provided

## 2021-04-27 NOTE — Patient Instructions (Signed)
°  Medication Instructions:  Your physician has recommended you make the following change in your medication:  -- INCREASE Mounjaro to 0.5 mg - Inject 0.5 mg once weekly --If you need a refill on any your medications before your next appointment, please call your pharmacy first. If no refills are authorized on file call the office.-- Follow-Up: Your next appointment:   Your physician recommends that you schedule a follow-up appointment in: 45 WEEKS with Dr. [name]  You will receive a text message or e-mail with a link to a survey about your care and experience with [place] today! We would greatly appreciate your feedback!   Thanks for letting us be apart of your health journey!!  Primary Care and Sports Medicine   Dr. Arlina Robes [place]   We encourage you to activate your patient portal called "MyChart".  Sign up information is provided on this After Visit Summary.  MyChart is used to connect with patients for Virtual Visits (Telemedicine).  Patients are able to view lab/test results, encounter notes, upcoming appointments, etc.  Non-urgent messages can be sent to your provider as well. To learn more about what you can do with MyChart, please visit --  [email or web address].

## 2021-04-27 NOTE — Assessment & Plan Note (Signed)
Patient reports that he has been tolerating Mounjaro well, currently taking 2.5 mg dose.  Denies any GI symptoms such as nausea, vomiting.  Also continues with metformin at this time. Discussed that since he has been tolerating medication well, can proceed with titration to 5 mg dose after 4 weeks of being at 2.5 mg dose.  Thus far has taken 3 doses of Mounjaro, prescription sent to pharmacy on file Plan for follow-up in about 1 month to monitor progress with 5 mg dosing.  If tolerating, likely continue with 5 mg dose and plan to recheck A1c to determine if further dose titration is warranted

## 2021-04-28 NOTE — Telephone Encounter (Signed)
error 

## 2021-05-18 ENCOUNTER — Ambulatory Visit (HOSPITAL_BASED_OUTPATIENT_CLINIC_OR_DEPARTMENT_OTHER): Payer: Medicare HMO | Admitting: Family Medicine

## 2021-05-20 ENCOUNTER — Telehealth: Payer: 59

## 2021-05-25 ENCOUNTER — Ambulatory Visit (INDEPENDENT_AMBULATORY_CARE_PROVIDER_SITE_OTHER): Payer: Medicare HMO | Admitting: Family Medicine

## 2021-05-25 ENCOUNTER — Other Ambulatory Visit: Payer: Self-pay

## 2021-05-25 ENCOUNTER — Other Ambulatory Visit (HOSPITAL_COMMUNITY): Payer: Self-pay

## 2021-05-25 ENCOUNTER — Encounter (HOSPITAL_BASED_OUTPATIENT_CLINIC_OR_DEPARTMENT_OTHER): Payer: Self-pay | Admitting: Family Medicine

## 2021-05-25 VITALS — BP 116/74 | HR 69 | Ht 74.25 in | Wt 249.0 lb

## 2021-05-25 DIAGNOSIS — I10 Essential (primary) hypertension: Secondary | ICD-10-CM | POA: Diagnosis not present

## 2021-05-25 DIAGNOSIS — E119 Type 2 diabetes mellitus without complications: Secondary | ICD-10-CM

## 2021-05-25 MED ORDER — LOSARTAN POTASSIUM 50 MG PO TABS
50.0000 mg | ORAL_TABLET | Freq: Every day | ORAL | 1 refills | Status: DC
  Filled 2021-05-25: qty 90, 90d supply, fill #0

## 2021-05-25 MED ORDER — TIRZEPATIDE 5 MG/0.5ML ~~LOC~~ SOAJ
5.0000 mg | SUBCUTANEOUS | 2 refills | Status: DC
  Filled 2021-05-25 – 2021-06-03 (×2): qty 2, 28d supply, fill #0
  Filled 2021-06-30: qty 2, 28d supply, fill #1

## 2021-05-25 NOTE — Patient Instructions (Signed)
°  Medication Instructions:  Your physician recommends that you continue on your current medications as directed. Please refer to the Current Medication list given to you today. --If you need a refill on any your medications before your next appointment, please call your pharmacy first. If no refills are authorized on file call the office.-- Follow-Up: Your next appointment:   Your physician recommends that you schedule a follow-up appointment in: 2 MONTHS with Dr. [name]  You will receive a text message or e-mail with a link to a survey about your care and experience with [place] today! We would greatly appreciate your feedback!   Thanks for letting us be apart of your health journey!!  Primary Care and Sports Medicine   Dr. Arlina Robes [place]   We encourage you to activate your patient portal called "MyChart".  Sign up information is provided on this After Visit Summary.  MyChart is used to connect with patients for Virtual Visits (Telemedicine).  Patients are able to view lab/test results, encounter notes, upcoming appointments, etc.  Non-urgent messages can be sent to your provider as well. To learn more about what you can do with MyChart, please visit --  [email or web address].

## 2021-05-25 NOTE — Progress Notes (Signed)
° ° °  Procedures performed today:    None.  Independent interpretation of notes and tests performed by another provider:   None.  Brief History, Exam, Impression, and Recommendations:    BP 116/74    Pulse 69    Ht 6' 2.25" (1.886 m)    Wt 249 lb (112.9 kg)    SpO2 99%    BMI 31.75 kg/m   Hypertension Patient has been noticing that with some recent weight loss, has had occasional lightheadedness, most notable when transitioning to standing position from either seated or lying.  He has thus far reduced his losartan, has been cutting pill in half.  He feels that this is been helpful, will check his blood pressure occasionally at home, has been well controlled despite decrease dose of losartan Given the patient has had some weight loss, some intermittent lightheadedness and signs suggestive of lower blood pressure, feel it is reasonable to continue with lower dose of losartan Discussed that with further weight loss, patient may notice that he is having fairly low blood pressures, in which case he may need to hold the losartan altogether, feel that this would be okay to do if he desires Would continue to monitor blood pressure closely at home during this process  Diabetes [facility]) Patient has been tolerating Mounjaro well.  He is currently doing 5 mg dose, GI symptoms are relatively mild, particularly if he adheres to some the dietary modifications which have allowed for him to avoid any potential side effects He has also been noticing some gradual weight loss which has been beneficial We will continue with current dose of Mounjaro, plan for follow-up in about 2 months to monitor progress or sooner as needed if he should develop any new symptoms or concerns   ___________________________________________ Tzirel Leonor [name], MD, ABFM, [facility] Primary Care and [place]

## 2021-06-02 ENCOUNTER — Encounter (HOSPITAL_BASED_OUTPATIENT_CLINIC_OR_DEPARTMENT_OTHER): Payer: Self-pay | Admitting: Family Medicine

## 2021-06-02 ENCOUNTER — Other Ambulatory Visit (HOSPITAL_COMMUNITY): Payer: Self-pay

## 2021-06-03 ENCOUNTER — Other Ambulatory Visit (HOSPITAL_COMMUNITY): Payer: Self-pay

## 2021-06-05 ENCOUNTER — Other Ambulatory Visit (HOSPITAL_COMMUNITY): Payer: Self-pay

## 2021-06-11 NOTE — Assessment & Plan Note (Signed)
Patient has been tolerating Mounjaro well.  He is currently doing 5 mg dose, GI symptoms are relatively mild, particularly if he adheres to some the dietary modifications which have allowed for him to avoid any potential side effects ?He has also been noticing some gradual weight loss which has been beneficial ?We will continue with current dose of Mounjaro, plan for follow-up in about 2 months to monitor progress or sooner as needed if he should develop any new symptoms or concerns ?

## 2021-06-11 NOTE — Assessment & Plan Note (Signed)
Patient has been noticing that with some recent weight loss, has had occasional lightheadedness, most notable when transitioning to standing position from either seated or lying.  He has thus far reduced his losartan, has been cutting pill in half.  He feels that this is been helpful, will check his blood pressure occasionally at home, has been well controlled despite decrease dose of losartan ?Given the patient has had some weight loss, some intermittent lightheadedness and signs suggestive of lower blood pressure, feel it is reasonable to continue with lower dose of losartan ?Discussed that with further weight loss, patient may notice that he is having fairly low blood pressures, in which case he may need to hold the losartan altogether, feel that this would be okay to do if he desires ?Would continue to monitor blood pressure closely at home during this process ?

## 2021-06-30 ENCOUNTER — Other Ambulatory Visit (HOSPITAL_COMMUNITY): Payer: Self-pay

## 2021-06-30 ENCOUNTER — Other Ambulatory Visit (HOSPITAL_BASED_OUTPATIENT_CLINIC_OR_DEPARTMENT_OTHER): Payer: Self-pay | Admitting: Family Medicine

## 2021-06-30 DIAGNOSIS — E1169 Type 2 diabetes mellitus with other specified complication: Secondary | ICD-10-CM

## 2021-06-30 MED ORDER — ATORVASTATIN CALCIUM 40 MG PO TABS
40.0000 mg | ORAL_TABLET | Freq: Every day | ORAL | 0 refills | Status: DC
  Filled 2021-06-30 – 2021-08-19 (×2): qty 90, 90d supply, fill #0

## 2021-06-30 MED ORDER — METFORMIN HCL 1000 MG PO TABS
1000.0000 mg | ORAL_TABLET | Freq: Two times a day (BID) | ORAL | 0 refills | Status: DC
  Filled 2021-06-30: qty 180, 90d supply, fill #0

## 2021-06-30 MED ORDER — TAMSULOSIN HCL 0.4 MG PO CAPS
0.4000 mg | ORAL_CAPSULE | Freq: Every day | ORAL | 0 refills | Status: DC
  Filled 2021-06-30: qty 90, 90d supply, fill #0

## 2021-07-01 ENCOUNTER — Other Ambulatory Visit (HOSPITAL_COMMUNITY): Payer: Self-pay

## 2021-07-23 ENCOUNTER — Other Ambulatory Visit (HOSPITAL_COMMUNITY): Payer: Self-pay

## 2021-07-23 ENCOUNTER — Encounter (HOSPITAL_BASED_OUTPATIENT_CLINIC_OR_DEPARTMENT_OTHER): Payer: Self-pay | Admitting: Family Medicine

## 2021-07-23 ENCOUNTER — Ambulatory Visit (INDEPENDENT_AMBULATORY_CARE_PROVIDER_SITE_OTHER): Payer: Medicare HMO | Admitting: Family Medicine

## 2021-07-23 VITALS — BP 122/76 | HR 67 | Temp 97.7°F | Ht 74.5 in | Wt 244.2 lb

## 2021-07-23 DIAGNOSIS — N400 Enlarged prostate without lower urinary tract symptoms: Secondary | ICD-10-CM | POA: Diagnosis not present

## 2021-07-23 DIAGNOSIS — E119 Type 2 diabetes mellitus without complications: Secondary | ICD-10-CM | POA: Diagnosis not present

## 2021-07-23 DIAGNOSIS — I10 Essential (primary) hypertension: Secondary | ICD-10-CM | POA: Diagnosis not present

## 2021-07-23 DIAGNOSIS — Z85820 Personal history of malignant melanoma of skin: Secondary | ICD-10-CM | POA: Insufficient documentation

## 2021-07-23 DIAGNOSIS — L821 Other seborrheic keratosis: Secondary | ICD-10-CM | POA: Insufficient documentation

## 2021-07-23 DIAGNOSIS — D485 Neoplasm of uncertain behavior of skin: Secondary | ICD-10-CM | POA: Insufficient documentation

## 2021-07-23 MED ORDER — TIRZEPATIDE 5 MG/0.5ML ~~LOC~~ SOAJ
5.0000 mg | SUBCUTANEOUS | 1 refills | Status: DC
  Filled 2021-07-23: qty 6, 84d supply, fill #0
  Filled 2021-07-24: qty 2, 28d supply, fill #0
  Filled 2021-08-19: qty 2, 28d supply, fill #1
  Filled 2021-09-18: qty 2, 28d supply, fill #2
  Filled 2021-10-13: qty 2, 28d supply, fill #3
  Filled 2021-11-12: qty 2, 28d supply, fill #4
  Filled 2021-12-21: qty 2, 28d supply, fill #5

## 2021-07-23 MED ORDER — TAMSULOSIN HCL 0.4 MG PO CAPS
0.4000 mg | ORAL_CAPSULE | Freq: Every day | ORAL | 0 refills | Status: DC
  Filled 2021-07-23: qty 90, 90d supply, fill #0

## 2021-07-23 NOTE — Assessment & Plan Note (Signed)
Requesting refill of tamsulosin today.  Feels that he may be having slightly worsening symptoms that are not being as well controlled with tamsulosin as they have been in the past.  He does follow with urology and plans to establish visit coming up to discuss further. ?Refill of tamsulosin provided today ?

## 2021-07-23 NOTE — Assessment & Plan Note (Addendum)
Has been tolerating Mounjaro well.  When he started with the new 5 mg dose he had some nausea initially but this has subsided with continued use ?Last hemoglobin A1c was stable at 6.6%.  Plan to recheck that at next visit ?Complete foot exam at next visit; discuss pneumococcal vaccination ?

## 2021-07-23 NOTE — Patient Instructions (Addendum)
Exercising to Stay Healthy ?To become healthy and stay healthy, it is recommended that you do moderate-intensity and vigorous-intensity exercise. You can tell that you are exercising at a moderate intensity if your heart starts beating faster and you start breathing faster but can still hold a conversation. You can tell that you are exercising at a vigorous intensity if you are breathing much harder and faster and cannot hold a conversation while exercising. ?How can exercise benefit me? ?Exercising regularly is important. It has many health benefits, such as: ?Improving overall fitness, flexibility, and endurance. ?Increasing bone density. ?Helping with weight control. ?Decreasing body fat. ?Increasing muscle strength and endurance. ?Reducing stress and tension, anxiety, depression, or anger. ?Improving overall health. ?What guidelines should I follow while exercising? ?Before you start a new exercise program, talk with your health care provider. ?Do not exercise so much that you hurt yourself, feel dizzy, or get very short of breath. ?Wear comfortable clothes and wear shoes with good support. ?Drink plenty of water while you exercise to prevent dehydration or heat stroke. ?Work out until your breathing and your heartbeat get faster (moderate intensity). ?How often should I exercise? ?Choose an activity that you enjoy, and set realistic goals. Your health care provider can help you make an activity plan that is individually designed and works best for you. ?Exercise regularly as told by your health care provider. This may include: ?Doing strength training two times a week, such as: ?Lifting weights. ?Using resistance bands. ?Push-ups. ?Sit-ups. ?Yoga. ?Doing a certain intensity of exercise for a given amount of time. Choose from these options: ?A total of 150 minutes of moderate-intensity exercise every week. ?A total of 75 minutes of vigorous-intensity exercise every week. ?A mix of moderate-intensity and  vigorous-intensity exercise every week. ?Children, pregnant women, people who have not exercised regularly, people who are overweight, and older adults may need to talk with a health care provider about what activities are safe to perform. If you have a medical condition, be sure to talk with your health care provider before you start a new exercise program. ?What are some exercise ideas? ?Moderate-intensity exercise ideas include: ?Walking 1 mile (1.6 km) in about 15 minutes. ?Biking. ?Hiking. ?Golfing. ?Dancing. ?Water aerobics. ?Vigorous-intensity exercise ideas include: ?Walking 4.5 miles (7.2 km) or more in about 1 hour. ?Jogging or running 5 miles (8 km) in about 1 hour. ?Biking 10 miles (16.1 km) or more in about 1 hour. ?Lap swimming. ?Roller-skating or in-line skating. ?Cross-country skiing. ?Vigorous competitive sports, such as football, basketball, and soccer. ?Jumping rope. ?Aerobic dancing. ?What are some everyday activities that can help me get exercise? ?[employer] work, such as: ?Pushing a Conservation officer, nature. ?Raking and bagging leaves. ?Washing your car. ?Pushing a stroller. ?Shoveling snow. ?Gardening. ?Washing windows or floors. ?How can I be more active in my day-to-day activities? ?Use stairs instead of an elevator. ?Take a walk during your lunch break. ?If you drive, park your car farther away from your work or school. ?If you take public transportation, get off one stop early and walk the rest of the way. ?Stand up or walk around during all of your indoor phone calls. ?Get up, stretch, and walk around every 30 minutes throughout the day. ?Enjoy exercise with a friend. Support to continue exercising will help you keep a regular routine of activity. ?Where to find more information ?You can find more information about exercising to stay healthy from: ?[place].S. Department of Health and Human Services: BondedCompany.at ?Centers for Disease Control and Prevention (  CDC): http://www.wolf.info/ ?Summary ?Exercising regularly is  important. It will improve your overall fitness, flexibility, and endurance. ?Regular exercise will also improve your overall health. It can help you control your weight, reduce stress, and improve your bone density. ?Do not exercise so much that you hurt yourself, feel dizzy, or get very short of breath. ?Before you start a new exercise program, talk with your health care provider. ?This information is not intended to replace advice given to you by your health care provider. Make sure you discuss any questions you have with your health care provider. ?Document Revised: 07/18/2020 Document Reviewed: 07/18/2020 ?Elsevier Patient Education ? [place]. ? ? ? ? ?Follow-Up: ?Your next appointment:   ?Your physician recommends that you schedule a follow-up appointment in: 2-3 month follow up with Dr. Tennis Must [place] ? ?You will receive a text message or e-mail with a link to a survey about your care and experience with [place] today! We would greatly appreciate your feedback!  ? ?Thanks for letting us be apart of your health journey!!  ?Primary Care and Sports Medicine  ? ?Dr. Kyung Rudd [name]  ? ?We encourage you to activate your patient portal called "MyChart".  Sign up information is provided on this After Visit Summary.  MyChart is used to connect with patients for Virtual Visits (Telemedicine).  Patients are able to view lab/test results, encounter notes, upcoming appointments, etc.  Non-urgent messages can be sent to your provider as well. To learn more about what you can do with MyChart, please visit --  [email or web address].   ? ?

## 2021-07-23 NOTE — Assessment & Plan Note (Signed)
Since last visit, patient has discontinued losartan due to having lower blood pressures at home.  He reports that at home his systolic blood pressure ranges from 947-654Y and diastolic has been in the 50P.  He has not had any issues with lightheadedness or dizziness, no chest pain or headaches ?He has primarily been focusing on diet and exercise as well as being on Mounjaro related to diabetes.  The combination of these has led to some weight loss and lower blood pressure ?At this time, can continue with lifestyle modifications, monitoring of blood pressure at home, DASH diet ?

## 2021-07-23 NOTE — Progress Notes (Signed)
? ? ?  Procedures performed today:   ? ?None. ? ?Independent interpretation of notes and tests performed by another provider:  ? ?None. ? ?Brief History, Exam, Impression, and Recommendations:   ? ?BP 122/76   Pulse 67   Temp 97.7 ?F (36.5 ?C)   Ht 6' 2.5" (1.892 m)   Wt 244 lb 3.2 oz (110.8 kg)   SpO2 97%   BMI 30.93 kg/m?  ? ?Hypertension ?Since last visit, patient has discontinued losartan due to having lower blood pressures at home.  He reports that at home his systolic blood pressure ranges from 644-034V and diastolic has been in the 42V.  He has not had any issues with lightheadedness or dizziness, no chest pain or headaches ?He has primarily been focusing on diet and exercise as well as being on Mounjaro related to diabetes.  The combination of these has led to some weight loss and lower blood pressure ?At this time, can continue with lifestyle modifications, monitoring of blood pressure at home, DASH diet ? ?Diabetes ([place]) ?Has been tolerating Mounjaro well.  When he started with the new 5 mg dose he had some nausea initially but this has subsided with continued use ?Last hemoglobin A1c was stable at 6.6%.  Plan to recheck that at next visit ?Complete foot exam at next visit; discuss pneumococcal vaccination ? ?BPH (benign prostatic hyperplasia) ?Requesting refill of tamsulosin today.  Feels that he may be having slightly worsening symptoms that are not being as well controlled with tamsulosin as they have been in the past.  He does follow with urology and plans to establish visit coming up to discuss further. ?Refill of tamsulosin provided today ? ?Plan for follow-up in about 2 to 3 months or sooner as needed ? ? ?___________________________________________ ?Krisy Dix [name], MD, ABFM, CAQSM ?Primary Care and Sports Medicine ?[facility] ?

## 2021-07-24 ENCOUNTER — Other Ambulatory Visit (HOSPITAL_COMMUNITY): Payer: Self-pay

## 2021-07-29 ENCOUNTER — Other Ambulatory Visit (HOSPITAL_COMMUNITY): Payer: Self-pay

## 2021-08-03 DIAGNOSIS — D1801 Hemangioma of skin and subcutaneous tissue: Secondary | ICD-10-CM | POA: Diagnosis not present

## 2021-08-03 DIAGNOSIS — L814 Other melanin hyperpigmentation: Secondary | ICD-10-CM | POA: Diagnosis not present

## 2021-08-03 DIAGNOSIS — L578 Other skin changes due to chronic exposure to nonionizing radiation: Secondary | ICD-10-CM | POA: Diagnosis not present

## 2021-08-03 DIAGNOSIS — Z85820 Personal history of malignant melanoma of skin: Secondary | ICD-10-CM | POA: Diagnosis not present

## 2021-08-03 DIAGNOSIS — L821 Other seborrheic keratosis: Secondary | ICD-10-CM | POA: Diagnosis not present

## 2021-08-03 DIAGNOSIS — Z85828 Personal history of other malignant neoplasm of skin: Secondary | ICD-10-CM | POA: Diagnosis not present

## 2021-08-03 DIAGNOSIS — L570 Actinic keratosis: Secondary | ICD-10-CM | POA: Diagnosis not present

## 2021-08-03 DIAGNOSIS — D229 Melanocytic nevi, unspecified: Secondary | ICD-10-CM | POA: Diagnosis not present

## 2021-08-07 ENCOUNTER — Ambulatory Visit (HOSPITAL_BASED_OUTPATIENT_CLINIC_OR_DEPARTMENT_OTHER): Payer: Medicare HMO

## 2021-08-19 ENCOUNTER — Other Ambulatory Visit (HOSPITAL_COMMUNITY): Payer: Self-pay

## 2021-08-20 ENCOUNTER — Other Ambulatory Visit (HOSPITAL_COMMUNITY): Payer: Self-pay

## 2021-08-20 DIAGNOSIS — Z125 Encounter for screening for malignant neoplasm of prostate: Secondary | ICD-10-CM | POA: Diagnosis not present

## 2021-08-20 DIAGNOSIS — R350 Frequency of micturition: Secondary | ICD-10-CM | POA: Diagnosis not present

## 2021-08-20 DIAGNOSIS — N401 Enlarged prostate with lower urinary tract symptoms: Secondary | ICD-10-CM | POA: Diagnosis not present

## 2021-08-20 DIAGNOSIS — R3912 Poor urinary stream: Secondary | ICD-10-CM | POA: Diagnosis not present

## 2021-08-20 MED ORDER — FINASTERIDE 5 MG PO TABS
5.0000 mg | ORAL_TABLET | Freq: Every day | ORAL | 3 refills | Status: DC
  Filled 2021-08-20: qty 90, 90d supply, fill #0
  Filled 2021-11-12: qty 90, 90d supply, fill #1
  Filled 2022-02-13: qty 90, 90d supply, fill #2
  Filled 2022-05-18: qty 90, 90d supply, fill #3

## 2021-08-24 ENCOUNTER — Other Ambulatory Visit (HOSPITAL_BASED_OUTPATIENT_CLINIC_OR_DEPARTMENT_OTHER): Payer: Self-pay

## 2021-09-04 ENCOUNTER — Encounter (HOSPITAL_BASED_OUTPATIENT_CLINIC_OR_DEPARTMENT_OTHER): Payer: Self-pay

## 2021-09-04 ENCOUNTER — Ambulatory Visit (INDEPENDENT_AMBULATORY_CARE_PROVIDER_SITE_OTHER): Payer: Medicare HMO

## 2021-09-04 DIAGNOSIS — E119 Type 2 diabetes mellitus without complications: Secondary | ICD-10-CM | POA: Diagnosis not present

## 2021-09-04 DIAGNOSIS — Z0000 Encounter for general adult medical examination without abnormal findings: Secondary | ICD-10-CM | POA: Diagnosis not present

## 2021-09-04 NOTE — Patient Instructions (Signed)
Health Maintenance, Male Adopting a healthy lifestyle and getting preventive care are important in promoting health and wellness. Ask your health care provider about: The right schedule for you to have regular tests and exams. Things you can do on your own to prevent diseases and keep yourself healthy. What should I know about diet, weight, and exercise? Eat a healthy diet  Eat a diet that includes plenty of vegetables, fruits, low-fat dairy products, and lean protein. Do not eat a lot of foods that are high in solid fats, added sugars, or sodium. Maintain a healthy weight Body mass index (BMI) is a measurement that can be used to identify possible weight problems. It estimates body fat based on height and weight. Your health care provider can help determine your BMI and help you achieve or maintain a healthy weight. Get regular exercise Get regular exercise. This is one of the most important things you can do for your health. Most adults should: Exercise for at least 150 minutes each week. The exercise should increase your heart rate and make you sweat (moderate-intensity exercise). Do strengthening exercises at least twice a week. This is in addition to the moderate-intensity exercise. Spend less time sitting. Even light physical activity can be beneficial. Watch cholesterol and blood lipids Have your blood tested for lipids and cholesterol at 67 years of age, then have this test every 5 years. You may need to have your cholesterol levels checked more often if: Your lipid or cholesterol levels are high. You are older than 67 years of age. You are at high risk for heart disease. What should I know about cancer screening? Many types of cancers can be detected early and may often be prevented. Depending on your health history and family history, you may need to have cancer screening at various ages. This may include screening for: Colorectal cancer. Prostate cancer. Skin cancer. Lung  cancer. What should I know about heart disease, diabetes, and high blood pressure? Blood pressure and heart disease High blood pressure causes heart disease and increases the risk of stroke. This is more likely to develop in people who have high blood pressure readings or are overweight. Talk with your health care provider about your target blood pressure readings. Have your blood pressure checked: Every 3-5 years if you are 18-39 years of age. Every year if you are 40 years old or older. If you are between the ages of 65 and 75 and are a current or former smoker, ask your health care provider if you should have a one-time screening for abdominal aortic aneurysm (AAA). Diabetes Have regular diabetes screenings. This checks your fasting blood sugar level. Have the screening done: Once every three years after age 45 if you are at a normal weight and have a low risk for diabetes. More often and at a younger age if you are overweight or have a high risk for diabetes. What should I know about preventing infection? Hepatitis B If you have a higher risk for hepatitis B, you should be screened for this virus. Talk with your health care provider to find out if you are at risk for hepatitis B infection. Hepatitis C Blood testing is recommended for: Everyone born from 1945 through 1965. Anyone with known risk factors for hepatitis C. Sexually transmitted infections (STIs) You should be screened each year for STIs, including gonorrhea and chlamydia, if: You are sexually active and are younger than 67 years of age. You are older than 67 years of age and your   health care provider tells you that you are at risk for this type of infection. Your sexual activity has changed since you were last screened, and you are at increased risk for chlamydia or gonorrhea. Ask your health care provider if you are at risk. Ask your health care provider about whether you are at high risk for HIV. Your health care provider  may recommend a prescription medicine to help prevent HIV infection. If you choose to take medicine to prevent HIV, you should first get tested for HIV. You should then be tested every 3 months for as long as you are taking the medicine. Follow these instructions at home: Alcohol use Do not drink alcohol if your health care provider tells you not to drink. If you drink alcohol: Limit how much you have to 0-2 drinks a day. Know how much alcohol is in your drink. In the [place] one drink equals one 12 oz bottle of beer (355 mL), one 5 oz glass of wine (148 mL), or one 1 oz glass of hard liquor (44 mL). Lifestyle Do not use any products that contain nicotine or tobacco. These products include cigarettes, chewing tobacco, and vaping devices, such as e-cigarettes. If you need help quitting, ask your health care provider. Do not use street drugs. Do not share needles. Ask your health care provider for help if you need support or information about quitting drugs. General instructions Schedule regular health, dental, and eye exams. Stay current with your vaccines. Tell your health care provider if: You often feel depressed. You have ever been abused or do not feel safe at home. Summary Adopting a healthy lifestyle and getting preventive care are important in promoting health and wellness. Follow your health care provider's instructions about healthy diet, exercising, and getting tested or screened for diseases. Follow your health care provider's instructions on monitoring your cholesterol and blood pressure. This information is not intended to replace advice given to you by your health care provider. Make sure you discuss any questions you have with your health care provider. Document Revised: 08/11/2020 Document Reviewed: 08/11/2020 Elsevier Patient Education  2023 [employer]  

## 2021-09-04 NOTE — Progress Notes (Signed)
Subjective:   Lance Davis is a 66 y.o. male who presents for an Initial Medicare Annual Wellness Visit. I connected with  [name] on 09/04/21 by a audio enabled telemedicine application and verified that I am speaking with the correct person using two identifiers.  Patient Location: Home  Provider Location: Office/Clinic  I discussed the limitations of evaluation and management by telemedicine. The patient expressed understanding and agreed to proceed.     Objective:    There were no vitals filed for this visit. There is no height or weight on file to calculate BMI.     09/04/2021   11:28 AM  Advanced Directives  Does Patient Have a Medical Advance Directive? Yes  Does patient want to make changes to medical advance directive? No - Patient declined    Current Medications (verified) Outpatient Encounter Medications as of 09/04/2021  Medication Sig   atorvastatin (LIPITOR) 40 MG tablet Take 1 tablet (40 mg total) by mouth daily.   finasteride (PROSCAR) 5 MG tablet Take 1 tablet (5 mg total) by mouth daily.   metFORMIN (GLUCOPHAGE) 1000 MG tablet Take 1 tablet (1,000 mg total) by mouth 2 (two) times daily with a meal.   tamsulosin (FLOMAX) 0.4 MG CAPS capsule Take 1 capsule (0.4 mg total) by mouth daily.   tirzepatide [facility]) 5 MG/0.5ML Pen Inject 5 mg into the skin once a week.   No facility-administered encounter medications on file as of 09/04/2021.    Allergies (verified) Patient has no known allergies.   History: Past Medical History:  Diagnosis Date   Allergy    spring time   Arthritis    Atypical nevus 04/26/2016   mild - right mid back   Blood transfusion 1963   Cancer ([place]) 2019   melanoma face   [place]ark level II melanoma ([place]) 12/13/2017   left inner cheek   Sleep apnea    cpap   Squamous cell carcinoma in situ of skin 04/26/2016   inner left cheek   Past Surgical History:  Procedure Laterality Date   COLONOSCOPY     I & D EXTREMITY   07/14/2011   Procedure: IRRIGATION AND DEBRIDEMENT EXTREMITY;  Surgeon: Linna Hoff, MD;  Location: [facility];  Service: Orthopedics;  Laterality: Left;   POLYPECTOMY     TENDON REPAIR  07/14/2011   Procedure: TENDON REPAIR;  Surgeon: Linna Hoff, MD;  Location: [facility] [place];  Service: Orthopedics;  Laterality: Left;   Family History  Problem Relation Age of Onset   Diabetes Mother    Heart disease Mother    Frontotemporal dementia Father    Diabetes Sister    Diabetes Brother    Colon cancer Neg Hx    Colon polyps Neg Hx    Esophageal cancer Neg Hx    Stomach cancer Neg Hx    Rectal cancer Neg Hx    Social History   Socioeconomic History   Marital status: Married    Spouse name: Not on file   Number of children: Not on file   Years of education: Not on file   Highest education level: Not on file  Occupational History   Not on file  Tobacco Use   Smoking status: Never   Smokeless tobacco: Never  Vaping Use   Vaping Use: Never used  Substance and Sexual Activity   Alcohol use: Yes    Alcohol/week: 14.0 standard drinks    Types: 14 Glasses of wine per week   Drug  use: No   Sexual activity: Yes  Other Topics Concern   Not on file  Social History Narrative   Not on file   Social Determinants of Health   Financial Resource Strain: Low Risk    Difficulty of Paying Living Expenses: Not hard at all  Food Insecurity: No Food Insecurity   Worried About Charity fundraiser in the Last Year: Never true   [place] in the Last Year: Never true  Transportation Needs: No Transportation Needs   Lack of Transportation (Medical): No   Lack of Transportation (Non-Medical): No  Physical Activity: Sufficiently Active   Days of Exercise per Week: 5 days   Minutes of Exercise per Session: 50 min  Stress: No Stress Concern Present   Feeling of Stress : Only a little  Social Connections: Moderately Integrated   Frequency of Communication with Friends and Family: More than three  times a week   Frequency of Social Gatherings with Friends and Family: More than three times a week   Attends Religious Services: Never   Marine scientist or Organizations: Yes   Attends Music therapist: More than 4 times per year   Marital Status: Married    Tobacco Counseling Counseling given: Not Answered   Clinical Intake:  Pre-visit preparation completed: Yes  Pain : No/denies pain     Diabetes: Yes CBG done?: No  How often do you need to have someone help you when you read instructions, pamphlets, or other written materials from your doctor or pharmacy?: 1 - Never What is the last grade level you completed in school?: college  Diabetic?yes Nutrition Risk Assessment:  Has the patient had any N/V/D within the last 2 months?  Yes  Does the patient have any non-healing wounds?  Yes  Has the patient had any unintentional weight loss or weight gain?  No   Diabetes:  Is the patient diabetic?  Yes  If diabetic, was a CBG obtained today?  No  Did the patient bring in their glucometer from home?  televisit How often do you monitor your CBG's? Once a week.   Financial Strains and Diabetes Management:  Are you having any financial strains with the device, your supplies or your medication? No .  Does the patient want to be seen by Chronic Care Management for management of their diabetes?  No  Would the patient like to be referred to a Nutritionist or for Diabetic Management?  No   Diabetic Exams:  Diabetic Eye Exam: Completed   Diabetic Foot Exam: Overdue, Pt has been advised about the importance in completing this exam. Pt is scheduled for diabetic foot exam on  .    Interpreter Needed?: No      Activities of Daily Living    09/04/2021   11:29 AM 07/23/2021    9:47 AM  In your present state of health, do you have any difficulty performing the following activities:  Hearing? 1 1  Vision? 0 0  Difficulty concentrating or making decisions? 0 0   Walking or climbing stairs? 0 0  Dressing or bathing? 0 0  Doing errands, shopping? 0 0  Preparing Food and eating ? N   Using the Toilet? N   In the past six months, have you accidently leaked urine? N   Do you have problems with loss of bowel control? N   Managing your Medications? N   Managing your Finances? N   Housekeeping or managing your Housekeeping?  N     Patient Care Team: [name], [name], MD as PCP - General (Family Medicine)  Indicate any recent Medical Services you may have received from other than [facility] providers in the past year (date may be approximate).     Assessment:   This is a routine wellness examination for Lance Davis.  Hearing/Vision screen No results found.  Dietary issues and exercise activities discussed:     Goals Addressed             This Visit's Progress    Increase physical activity        Depression Screen    09/04/2021   11:28 AM 09/04/2021   11:23 AM 07/23/2021    9:47 AM 11/05/2020   11:48 AM  PHQ 2/9 Scores  PHQ - 2 Score 0 0 0 0    Fall Risk    09/04/2021   11:28 AM 07/23/2021    9:46 AM  [place] in the past year? 0 0  Number falls in past yr: 0 0  Injury with Fall? 0 0  Risk for fall due to : No Fall Risks No Fall Risks  Follow up Falls evaluation completed Falls evaluation completed    [place]:  Any stairs in or around the home? Yes  If so, are there any without handrails? Yes  Home free of loose throw rugs in walkways, pet beds, electrical cords, etc? Yes  Adequate lighting in your home to reduce risk of falls? Yes   ASSISTIVE DEVICES UTILIZED TO PREVENT FALLS:  Life alert? No  Use of a cane, walker or w/c? No  Grab bars in the bathroom? No  Shower chair or bench in shower? No  Elevated toilet seat or a handicapped toilet? No   Cognitive Function:        09/04/2021   11:30 AM  6CIT Screen  What Year? 0 points  What month? 0 points  What time? 0 points  Count  back from 20 0 points  Months in reverse 0 points  Repeat phrase 2 points  Total Score 2 points    Immunizations Immunization History  Administered Date(s) Administered   Moderna SARS-COV2 Booster Vaccination 10/08/2020   PFIZER(Purple Top)SARS-COV-2 Vaccination 01/11/2020   Pfizer Covid-19 Vaccine Bivalent Booster 50yr & up 12/15/2020   Tetanus 04/05/2014   Zoster Recombinat (Shingrix) 03/30/2017, 07/01/2017    TDAP status: Up to date  Flu Vaccine status: Up to date  Pneumococcal vaccine status: Due, Education has been provided regarding the importance of this vaccine. Advised may receive this vaccine at local pharmacy or Health Dept. Aware to provide a copy of the vaccination record if obtained from local pharmacy or Health Dept. Verbalized acceptance and understanding.  Covid-19 vaccine status: Completed vaccines  Qualifies for Shingles Vaccine? Yes   Zostavax completed Yes   Shingrix Completed?: Yes  Screening Tests Health Maintenance  Topic Date Due   FOOT EXAM  Never done   OPHTHALMOLOGY EXAM  Never done   Hepatitis C Screening  Never done   Pneumonia Vaccine 67 Years old (1 - PCV) Never done   COVID-19 Vaccine (3 - Pfizer risk series) 01/12/2021   HEMOGLOBIN A1C  10/05/2021   INFLUENZA VACCINE  11/03/2021   URINE MICROALBUMIN  04/07/2022   TETANUS/TDAP  04/05/2024   COLONOSCOPY (Pts 45-434yrInsurance coverage will need to be confirmed)  02/16/2028   Zoster Vaccines- Shingrix  Completed   HPV VACCINES  Aged  Out    Health Maintenance  Health Maintenance Due  Topic Date Due   FOOT EXAM  Never done   OPHTHALMOLOGY EXAM  Never done   Hepatitis C Screening  Never done   Pneumonia Vaccine 65+ Years old (1 - PCV) Never done   COVID-19 Vaccine (3 - Pfizer risk series) 01/12/2021    Colorectal cancer screening: Type of screening: Colonoscopy. Completed 2019. Repeat every 10 years  Lung Cancer Screening: (Low Dose CT Chest recommended if Age 33-80 years, 30  pack-year currently smoking OR have quit w/in 15years.) does not qualify.   Lung Cancer Screening Referral: none  Additional Screening:  Hepatitis C Screening: does not qualify; Completed yes  Vision Screening: Recommended annual ophthalmology exams for early detection of glaucoma and other disorders of the eye. Is the patient up to date with their annual eye exam?  Yes  Who is the provider or what is the name of the office in which the patient attends annual eye exams? Currently looking If pt is not established with a provider, would they like to be referred to a provider to establish care? Yes .   Dental Screening: Recommended annual dental exams for proper oral hygiene  Community Resource Referral / Chronic Care Management: CRR required this visit?  No   CCM required this visit?  No      Plan:     I have personally reviewed and noted the following in the patient's chart:   Medical and social history Use of alcohol, tobacco or illicit drugs  Current medications and supplements including opioid prescriptions. Patient is not currently taking opioid prescriptions. Functional ability and status Nutritional status Physical activity Advanced directives List of other physicians Hospitalizations, surgeries, and ER visits in previous 12 months Vitals Screenings to include cognitive, depression, and falls Referrals and appointments  In addition, I have reviewed and discussed with patient certain preventive protocols, quality metrics, and best practice recommendations. A written personalized care plan for preventive services as well as general preventive health recommendations were provided to patient.     Octavio Manns   09/04/2021   Nurse Notes: Pt requested referral for diabetic eye exam. Referral placed.   Lance Davis , Thank you for taking time to come for your Medicare Wellness Visit. I appreciate your ongoing commitment to your health goals. Please review the following  plan we discussed and let me know if I can assist you in the future.   These are the goals we discussed:  Goals       Increase physical activity      Medication Monitoring (pt-stated)      Patient Goals/Self-Care Activities Over the next 90 days, patient will:  - take medications as prescribed check glucose periodically daily, document, and provide at future appointments check blood pressure periodically, document, and provide at future appointments target a minimum of 150 minutes of moderate intensity exercise weekly engage in dietary modifications by reducing carbohydrate intake        This is a list of the screening recommended for you and due dates:  Health Maintenance  Topic Date Due   Complete foot exam   Never done   Eye exam for diabetics  Never done   Hepatitis C Screening: USPSTF Recommendation to screen - Ages 77-79 yo.  Never done   Pneumonia Vaccine (1 - PCV) Never done   COVID-19 Vaccine (3 - Pfizer risk series) 01/12/2021   Hemoglobin A1C  10/05/2021   Flu Shot  11/03/2021  Urine Protein Check  04/07/2022   Tetanus Vaccine  04/05/2024   Colon Cancer Screening  02/16/2028   Zoster (Shingles) Vaccine  Completed   HPV Vaccine  Aged Out

## 2021-09-07 ENCOUNTER — Telehealth (HOSPITAL_BASED_OUTPATIENT_CLINIC_OR_DEPARTMENT_OTHER): Payer: Self-pay | Admitting: Family Medicine

## 2021-09-07 NOTE — Telephone Encounter (Signed)
LVM for pt to schedule AWV.

## 2021-09-18 ENCOUNTER — Other Ambulatory Visit (HOSPITAL_COMMUNITY): Payer: Self-pay

## 2021-09-23 ENCOUNTER — Other Ambulatory Visit (HOSPITAL_COMMUNITY): Payer: Self-pay

## 2021-09-24 ENCOUNTER — Other Ambulatory Visit (HOSPITAL_COMMUNITY): Payer: Self-pay

## 2021-09-28 ENCOUNTER — Other Ambulatory Visit (HOSPITAL_BASED_OUTPATIENT_CLINIC_OR_DEPARTMENT_OTHER): Payer: Self-pay | Admitting: Family Medicine

## 2021-09-28 ENCOUNTER — Other Ambulatory Visit (HOSPITAL_COMMUNITY): Payer: Self-pay

## 2021-09-28 DIAGNOSIS — E1169 Type 2 diabetes mellitus with other specified complication: Secondary | ICD-10-CM

## 2021-09-28 DIAGNOSIS — N400 Enlarged prostate without lower urinary tract symptoms: Secondary | ICD-10-CM

## 2021-09-28 MED ORDER — METFORMIN HCL 1000 MG PO TABS
1000.0000 mg | ORAL_TABLET | Freq: Two times a day (BID) | ORAL | 0 refills | Status: DC
  Filled 2021-09-28: qty 180, 90d supply, fill #0

## 2021-09-28 MED ORDER — TAMSULOSIN HCL 0.4 MG PO CAPS
0.4000 mg | ORAL_CAPSULE | Freq: Every day | ORAL | 0 refills | Status: DC
  Filled 2021-09-28 – 2021-10-07 (×2): qty 90, 90d supply, fill #0

## 2021-10-07 ENCOUNTER — Other Ambulatory Visit (HOSPITAL_COMMUNITY): Payer: Self-pay

## 2021-10-13 ENCOUNTER — Other Ambulatory Visit (HOSPITAL_COMMUNITY): Payer: Self-pay

## 2021-10-14 ENCOUNTER — Encounter (HOSPITAL_BASED_OUTPATIENT_CLINIC_OR_DEPARTMENT_OTHER): Payer: Self-pay | Admitting: Family Medicine

## 2021-10-14 ENCOUNTER — Ambulatory Visit (INDEPENDENT_AMBULATORY_CARE_PROVIDER_SITE_OTHER): Payer: Medicare HMO | Admitting: Family Medicine

## 2021-10-14 DIAGNOSIS — R079 Chest pain, unspecified: Secondary | ICD-10-CM | POA: Diagnosis not present

## 2021-10-14 NOTE — Progress Notes (Signed)
    Procedures performed today:    None.  Independent interpretation of notes and tests performed by another provider:   None.  Brief History, Exam, Impression, and Recommendations:    BP 112/81   Pulse 60   Ht '6\' 2"'$  (1.88 m)   Wt 238 lb 6.4 oz (108.1 kg)   SpO2 99%   BMI 30.61 kg/m   Chest pain Patient reports that yesterday while playing golf, he began to experience some chest discomfort/tightness, lightheadedness, some dizziness.  He feels that he also has some difficulty with concentrating, feels that he had to focus more than usual when he came to playing golf.  Does feel that his pulse was slightly elevated more than he would typically expect while playing golf, thinks it was about 90 bpm.  Patient does have a history of diabetes and dyslipidemia, both are well controlled at present. Today, he still has some chest discomfort, generally symptoms are improved as compared to yesterday.  Does not currently have any lightheadedness or dizziness. On exam today, vital signs are stable, no tachycardia, normal blood pressure.  Lungs are clear to auscultation bilaterally, heart with regular rate and rhythm, no murmurs appreciated. Given history and symptoms, EKG completed in the office today.  EKG generally reassuring, findings include sinus rhythm, slight bradycardia at 58 bpm, left axis deviation, normal QT intervals, no T wave inversions. Discussed general recommendations with patient, will proceed with referral to cardiology for further evaluation and recommendations.  He is requesting referral to see Dr. Burt Knack, referral placed. Discussed that if symptoms do recur, low threshold for evaluation in the emergency department given his medical history as well as family history. He does have follow-up scheduled with me for next week, will plan to keep this appointment  No follow-ups on file.   ___________________________________________ Lance Tamburrino [name], MD, ABFM, [facility] Primary Care and  [place]

## 2021-10-14 NOTE — Assessment & Plan Note (Addendum)
Patient reports that yesterday while playing golf, he began to experience some chest discomfort/tightness, lightheadedness, some dizziness.  He feels that he also has some difficulty with concentrating, feels that he had to focus more than usual when he came to playing golf.  Does feel that his pulse was slightly elevated more than he would typically expect while playing golf, thinks it was about 90 bpm.  Patient does have a history of diabetes and dyslipidemia, both are well controlled at present. Today, he still has some chest discomfort, generally symptoms are improved as compared to yesterday.  Does not currently have any lightheadedness or dizziness. On exam today, vital signs are stable, no tachycardia, normal blood pressure.  Lungs are clear to auscultation bilaterally, heart with regular rate and rhythm, no murmurs appreciated. Given history and symptoms, EKG completed in the office today.  EKG generally reassuring, findings include sinus rhythm, slight bradycardia at 58 bpm, left axis deviation, normal QT intervals, no T wave inversions. Discussed general recommendations with patient, will proceed with referral to cardiology for further evaluation and recommendations.  He is requesting referral to see Dr. Burt Knack, referral placed. Discussed that if symptoms do recur, low threshold for evaluation in the emergency department given his medical history as well as family history. He does have follow-up scheduled with me for next week, will plan to keep this appointment

## 2021-10-21 ENCOUNTER — Ambulatory Visit: Payer: Medicare HMO | Admitting: Cardiovascular Disease

## 2021-10-22 ENCOUNTER — Ambulatory Visit (INDEPENDENT_AMBULATORY_CARE_PROVIDER_SITE_OTHER): Payer: Medicare HMO | Admitting: Family Medicine

## 2021-10-22 ENCOUNTER — Encounter (HOSPITAL_BASED_OUTPATIENT_CLINIC_OR_DEPARTMENT_OTHER): Payer: Self-pay | Admitting: Family Medicine

## 2021-10-22 VITALS — BP 116/77 | HR 68 | Ht 74.0 in | Wt 241.2 lb

## 2021-10-22 DIAGNOSIS — E119 Type 2 diabetes mellitus without complications: Secondary | ICD-10-CM

## 2021-10-22 DIAGNOSIS — I10 Essential (primary) hypertension: Secondary | ICD-10-CM

## 2021-10-22 DIAGNOSIS — M25562 Pain in left knee: Secondary | ICD-10-CM

## 2021-10-22 LAB — HEMOGLOBIN A1C
Est. average glucose Bld gHb Est-mCnc: 117 mg/dL
Hgb A1c MFr Bld: 5.7 % — ABNORMAL HIGH (ref 4.8–5.6)

## 2021-10-22 NOTE — Progress Notes (Signed)
    Procedures performed today:    None.  Independent interpretation of notes and tests performed by another provider:   None.  Brief History, Exam, Impression, and Recommendations:    BP 116/77   Pulse 68   Ht '6\' 2"'$  (1.88 m)   Wt 241 lb 3.2 oz (109.4 kg)   SpO2 100%   BMI 30.97 kg/m   Hypertension Blood pressure at goal in office today.  Continue with lifestyle modifications, working towards gradual weight loss Recommend intermittent monitoring at home, DASH diet  Diabetes ([place]) Continues with metformin and Mounjaro.  Tolerating medications well.  Most recent hemoglobin A1c is at goal Has noticed weight loss with use of Mounjaro which has generally been beneficial, however feels that it has been difficult to gain muscle mass Due for hemoglobin A1c today, will check At next visit, complete diabetic foot exam  Left knee pain New issue for patient, did have recent episode of some knee discomfort, indicates primarily over anterior knee.  Today is not too problematic for him.  He thinks he might have some swelling around time of pain.  Does not recall any inciting event or injury Reports history of right knee issues with arthroscopic procedure in the past On exam, there is some mild joint line tenderness.  Normal range of motion, no significant crepitus.  Good strength.  Negative Lachman.  Negative McMurray. Discussed possibly of underlying osteoarthritis as potential cause for symptoms.  Can continue with activities as tolerated Order placed for x-rays to be completed at patient's preference.  Advised that if pain does return, could proceed with imaging If pain becomes more of an issue, can return to clinic for further evaluation  Return in about 3 months (around 01/22/2022) for DM, HTN, Left knee pain.   ___________________________________________ Raea Magallon [name], MD, ABFM, [facility] and [place]

## 2021-10-22 NOTE — Assessment & Plan Note (Signed)
Continues with metformin and Mounjaro.  Tolerating medications well.  Most recent hemoglobin A1c is at goal Has noticed weight loss with use of Mounjaro which has generally been beneficial, however feels that it has been difficult to gain muscle mass Due for hemoglobin A1c today, will check At next visit, complete diabetic foot exam

## 2021-10-22 NOTE — Assessment & Plan Note (Signed)
Blood pressure at goal in office today.  Continue with lifestyle modifications, working towards gradual weight loss Recommend intermittent monitoring at home, DASH diet

## 2021-10-22 NOTE — Patient Instructions (Signed)
  Medication Instructions:  Your physician recommends that you continue on your current medications as directed. Please refer to the Current Medication list given to you today. --If you need a refill on any your medications before your next appointment, please call your pharmacy first. If no refills are authorized on file call the office.-- Lab Work: Your physician has recommended that you have lab work today: Yes If you have labs (blood work) drawn today and your tests are completely normal, you will receive your results via [place] a phone call from our staff.  Please ensure you check your voicemail in the event that you authorized detailed messages to be left on a delegated number. If you have any lab test that is abnormal or we need to change your treatment, we will call you to review the results.  Referrals/Procedures/Imaging: X-RAY  Follow-Up: Your next appointment:   Your physician recommends that you schedule a follow-up appointment in: 3-4 months with Dr. [name].  You will receive a text message or e-mail with a link to a survey about your care and experience with [place] today! We would greatly appreciate your feedback!   Thanks for letting us be apart of your health journey!!  Primary Care and Sports Medicine   Dr. Arlina Robes [place]   We encourage you to activate your patient portal called "MyChart".  Sign up information is provided on this After Visit Summary.  MyChart is used to connect with patients for Virtual Visits (Telemedicine).  Patients are able to view lab/test results, encounter notes, upcoming appointments, etc.  Non-urgent messages can be sent to your provider as well. To learn more about what you can do with MyChart, please visit --  [email or web address].

## 2021-10-22 NOTE — Assessment & Plan Note (Signed)
New issue for patient, did have recent episode of some knee discomfort, indicates primarily over anterior knee.  Today is not too problematic for him.  He thinks he might have some swelling around time of pain.  Does not recall any inciting event or injury Reports history of right knee issues with arthroscopic procedure in the past On exam, there is some mild joint line tenderness.  Normal range of motion, no significant crepitus.  Good strength.  Negative Lachman.  Negative McMurray. Discussed possibly of underlying osteoarthritis as potential cause for symptoms.  Can continue with activities as tolerated Order placed for x-rays to be completed at patient's preference.  Advised that if pain does return, could proceed with imaging If pain becomes more of an issue, can return to clinic for further evaluation

## 2021-11-04 DIAGNOSIS — E119 Type 2 diabetes mellitus without complications: Secondary | ICD-10-CM | POA: Diagnosis not present

## 2021-11-04 LAB — HM DIABETES EYE EXAM

## 2021-11-11 ENCOUNTER — Encounter (HOSPITAL_BASED_OUTPATIENT_CLINIC_OR_DEPARTMENT_OTHER): Payer: Self-pay | Admitting: Cardiovascular Disease

## 2021-11-11 ENCOUNTER — Ambulatory Visit (HOSPITAL_BASED_OUTPATIENT_CLINIC_OR_DEPARTMENT_OTHER): Payer: Medicare HMO | Admitting: Cardiovascular Disease

## 2021-11-11 VITALS — BP 122/78 | HR 63 | Ht 74.5 in | Wt 245.1 lb

## 2021-11-11 DIAGNOSIS — R002 Palpitations: Secondary | ICD-10-CM

## 2021-11-11 DIAGNOSIS — I700 Atherosclerosis of aorta: Secondary | ICD-10-CM

## 2021-11-11 DIAGNOSIS — I7121 Aneurysm of the ascending aorta, without rupture: Secondary | ICD-10-CM | POA: Diagnosis not present

## 2021-11-11 HISTORY — DX: Atherosclerosis of aorta: I70.0

## 2021-11-11 HISTORY — DX: Palpitations: R00.2

## 2021-11-11 NOTE — Progress Notes (Signed)
Cardiology Office Note:    Date:  11/11/2021   ID:  Lance Davis, DOB 01/27/1956, MRN [id number]  PCP:  [name], [name], MD   [facility] Providers Cardiologist:  None     Referring MD: [name], [name], MD   No chief complaint on file.   History of Present Illness:    Lance Davis is a 66 y.o. male with a hx of hypertension, diabetes, here for the evaluation of chest pain. On 10/2021 he was playing golf and felt chest tightness and dizziness. He saw his PCP the following day and still had chest discomfort. EKG was unremarkable. He was referred to cardiology for further evaluation. He previously had a calcium score in 2011 with a score of 145 which was 62nd percentile. He had a mild ascending aortic aneurysm at 4.1 cm. Chest CT 02/2021 was unchanged.  Today, he is doing well. He had been playing golf and his heart rate became elevated, so he was wondering if he was in Afib that could've been due to dehydration. His heart rate was quicker and had not improved for a couple of days. He denies having any chest pressure/tightness that day. He was doing a lot of walking on the course but has not had any trouble breathing or episodes of rapid heart rates. His BP at home is usually around 970'Y systolic and mid 63'Z to 85'Y diastolic. He is doing more exercise at least 3-5 times a week. He is also walking every morning and feels better. He has 1-3 drinks of alcohol, usually wine, daily and does not smoke. He denies any chest pain, shortness of breath, or peripheral edema. No lightheadedness, headaches, syncope, orthopnea, or PND.   Past Medical History:  Diagnosis Date   Allergy    spring time   Aortic atherosclerosis ([place]) 11/11/2021   Arthritis    Atypical nevus 04/26/2016   mild - right mid back   Blood transfusion 1963   Cancer ([place]) 2019   melanoma face   Clark level II melanoma ([place]) 12/13/2017   left inner cheek   Palpitations 11/11/2021   Sleep apnea    cpap    Squamous cell carcinoma in situ of skin 04/26/2016   inner left cheek    Past Surgical History:  Procedure Laterality Date   COLONOSCOPY     I & D EXTREMITY  07/14/2011   Procedure: IRRIGATION AND DEBRIDEMENT EXTREMITY;  Surgeon: Linna Hoff, MD;  Location: [facility];  Service: Orthopedics;  Laterality: Left;   POLYPECTOMY     TENDON REPAIR  07/14/2011   Procedure: TENDON REPAIR;  Surgeon: Linna Hoff, MD;  Location: [facility];  Service: Orthopedics;  Laterality: Left;    Current Medications: Current Meds  Medication Sig   atorvastatin (LIPITOR) 40 MG tablet Take 1 tablet (40 mg total) by mouth daily.   finasteride (PROSCAR) 5 MG tablet Take 1 tablet (5 mg total) by mouth daily.   metFORMIN (GLUCOPHAGE) 1000 MG tablet Take 1 tablet (1,000 mg total) by mouth 2 (two) times daily with a meal.   tamsulosin (FLOMAX) 0.4 MG CAPS capsule Take 1 capsule (0.4 mg total) by mouth daily.   tirzepatide [facility]) 5 MG/0.5ML Pen Inject 5 mg into the skin once a week.     Allergies:   Patient has no known allergies.   Social History   Socioeconomic History   Marital status: Married    Spouse name: Not on file   Number of  children: Not on file   Years of education: Not on file   Highest education level: Not on file  Occupational History   Not on file  Tobacco Use   Smoking status: Never   Smokeless tobacco: Never  Vaping Use   Vaping Use: Never used  Substance and Sexual Activity   Alcohol use: Yes    Alcohol/week: 14.0 standard drinks of alcohol    Types: 14 Glasses of wine per week   Drug use: No   Sexual activity: Yes  Other Topics Concern   Not on file  Social History Narrative   Not on file   Social Determinants of Health   Financial Resource Strain: Low Risk  (09/04/2021)   Overall Financial Resource Strain (CARDIA)    Difficulty of Paying Living Expenses: Not hard at all  Food Insecurity: No Food Insecurity (09/04/2021)   Hunger Vital Sign    Worried About Running Out of  Food in the Last Year: Never true    Ran Out of Food in the Last Year: Never true  Transportation Needs: No Transportation Needs (09/04/2021)   PRAPARE - Hydrologist (Medical): No    Lack of Transportation (Non-Medical): No  Physical Activity: Sufficiently Active (09/04/2021)   Exercise Vital Sign    Days of Exercise per Week: 5 days    Minutes of Exercise per Session: 50 min  Stress: No Stress Concern Present (09/04/2021)   [place]    Feeling of Stress : Only a little  Social Connections: Moderately Integrated (09/04/2021)   Social Connection and Isolation Panel [NHANES]    Frequency of Communication with Friends and Family: More than three times a week    Frequency of Social Gatherings with Friends and Family: More than three times a week    Attends Religious Services: Never    Marine scientist or Organizations: Yes    Attends Music therapist: More than 4 times per year    Marital Status: Married     Family History: The patient's family history includes CAD in his mother; Diabetes in his brother, mother, and sister; Epilepsy in his sister; Frontotemporal dementia in his father; Heart disease in his mother; Valvular heart disease in his father and maternal uncle. There is no history of Colon cancer, Colon polyps, Esophageal cancer, Stomach cancer, or Rectal cancer.  ROS:   Please see the history of present illness.    All other systems reviewed and are negative.  EKGs/Labs/Other Studies Reviewed:    The following studies were reviewed today:  CTA Chest/Aorta 02/05/2021:  IMPRESSION: 1. 4.1 cm ascending thoracic aortic aneurysm (previously 4.1) without complicating features. Recommend annual imaging followup by CTA or MRA. This recommendation follows 2010 ACCF/AHA/AATS/ACR/ASA/SCA/SCAI/SIR/STS/SVM Guidelines for the Diagnosis and Management of Patients with Thoracic  Aortic Disease. Circulation. 2010; 121: T245-Y099 2. Renal cystic lesions, incompletely characterized.  Coronary CT 02/21/2020: FINDINGS: CORONARY CALCIUM SCORES:   Left Main: 21.5   LAD: 64.5   LCx: 0   RCA: 59.3   Total Agatston Score: 145   MESA database percentile: 62   AORTA MEASUREMENTS:   Ascending Aorta: 41 mm   Descending Aorta: 33 mm   IMPRESSION: 1. Coronary calcium score is 145 and this is at percentile 62 for patients of the same age, gender and ethnicity. 2. Fusiform aneurysm of the ascending thoracic aorta measuring up to 4.1 cm. Recommend annual imaging followup by CTA or MRA.  This recommendation follows 2010 ACCF/AHA/AATS/ACR/ASA/SCA/SCAI/SIR/STS/SVM Guidelines for the Diagnosis and Management of Patients with Thoracic Aortic Disease. Circulation. 2010; 121: S283-T517. Aortic aneurysm NOS (ICD10-I71.9) 3.  Aortic Atherosclerosis (ICD10-I70.0). 4. Patchy densities in the right lower lung. Similar findings on the abdominal CT from 2020 but difficult to exclude slightly increased densities in this area. Findings likely related to post inflammatory changes and scarring. Recommend attention to this area during follow-up of the thoracic aortic aneurysm.   EKG:  EKG is personally reviewed. 11/11/2021:  Sinus rhythm. Rate 63 bpm. Poor R wave progression. LAFB.   Recent Labs: No results found for requested labs within last 365 days.   Recent Lipid Panel    Component Value Date/Time   CHOL 134 11/05/2020 0925   TRIG 46 11/05/2020 0925   HDL 57 11/05/2020 0925   CHOLHDL 2.4 11/05/2020 0925   LDLCALC 66 11/05/2020 0925    Physical Exam:    Wt Readings from Last 3 Encounters:  11/11/21 245 lb 1.6 oz (111.2 kg)  10/22/21 241 lb 3.2 oz (109.4 kg)  10/14/21 238 lb 6.4 oz (108.1 kg)    VS:  BP 122/78 (BP Location: Right Arm, Patient Position: Sitting, Cuff Size: Large)   Pulse 63   Ht 6' 2.5" (1.892 m)   Wt 245 lb 1.6 oz (111.2 kg)   BMI 31.05  kg/m  , BMI Body mass index is 31.05 kg/m. GENERAL:  Well appearing HEENT: Pupils equal round and reactive, fundi not visualized, oral mucosa unremarkable NECK:  No jugular venous distention, waveform within normal limits, carotid upstroke brisk and symmetric, no bruits, no thyromegaly LUNGS:  Clear to auscultation bilaterally HEART:  RRR.  PMI not displaced or sustained,S1 and S2 within normal limits, no S3, no S4, no clicks, no rubs, no murmurs ABD:  Flat, positive bowel sounds normal in frequency in pitch, no bruits, no rebound, no guarding, no midline pulsatile mass, no hepatomegaly, no splenomegaly EXT:  2 plus pulses throughout, no edema, no cyanosis no clubbing SKIN:  No rashes no nodules NEURO:  Cranial nerves II through XII grossly intact, motor grossly intact throughout PSYCH:  Cognitively intact, oriented to person place and time   ASSESSMENT:    1. Aneurysm of ascending aorta without rupture ([place])   2. Aortic atherosclerosis ([facility])   3. Palpitations    PLAN:    In order of problems listed above:  Thoracic ascending aortic aneurysm Mild ascending aortic aneurysm.  This was stable on CT in 2021 and 2022.  We will get an echocardiogram to assess for stability.  Blood pressure is well-controlled.  Aortic atherosclerosis ([place]) Noted on chest CT.  Lipids are very well-controlled on atorvastatin.  Keep working on diet and exercise.  We discussed limiting animal products is much as possible.  Palpitations He had an episode of palpitations while walking on the golf course.  Overall it sounds like this was an episode of intravascular volume depletion.  His heart rate was regular but a little faster than usual for him.  No evidence of atrial fibrillation.  He has not had any recurrent episodes and continues to be very physically active without symptoms.  Continue to monitor for now.  We did discuss getting a Kardia mobile device in the future.    Disposition: Follow up with  Bailey Faiella C. Oval Linsey, MD, [facility] in 1 year.  Medication Adjustments/Labs and Tests Ordered: Current medicines are reviewed at length with the patient today.  Concerns regarding medicines are outlined above.   Orders  Placed This Encounter  Procedures   EKG 12-Lead   ECHOCARDIOGRAM COMPLETE   No orders of the defined types were placed in this encounter.  Patient Instructions  Medication Instructions:  Your physician recommends that you continue on your current medications as directed. Please refer to the Current Medication list given to you today.   Labwork: NONE  Testing/Procedures: Your physician has requested that you have an echocardiogram. Echocardiography is a painless test that uses sound waves to create images of your heart. It provides your doctor with information about the size and shape of your heart and how well your heart's chambers and valves are working. This procedure takes approximately one hour. There are no restrictions for this procedure. NOW AND AGAIN IN 1 YEAR PRIOR TO FOLLOW UP WITH DR [place]   Follow-Up: Your physician wants you to follow-up in: DR [facility] AFTER ECHO IN 1 YEAR You will receive a reminder letter in the mail two months in advance. If you don't receive a letter, please call our office to schedule the follow-up appointment.  Consider getting a Kardia Mobile device to check your heart rate.   I,Jenifer Arts development officer as a Education administrator for [place], MD.,have documented all relevant documentation on the behalf of Skeet Latch, MD,as directed by  Skeet Latch, MD while in the presence of Skeet Latch, MD.  I, Jensen Oval Linsey, MD have reviewed all documentation for this visit.  The documentation of the exam, diagnosis, procedures, and orders on 11/11/2021 are all accurate and complete.   Signed, Skeet Latch, MD  11/11/2021 5:09 PM    [place]

## 2021-11-11 NOTE — Assessment & Plan Note (Signed)
Noted on chest CT.  Lipids are very well-controlled on atorvastatin.  Keep working on diet and exercise.  We discussed limiting animal products is much as possible.

## 2021-11-11 NOTE — Patient Instructions (Addendum)
Medication Instructions:  Your physician recommends that you continue on your current medications as directed. Please refer to the Current Medication list given to you today.   Labwork: NONE  Testing/Procedures: Your physician has requested that you have an echocardiogram. Echocardiography is a painless test that uses sound waves to create images of your heart. It provides your doctor with information about the size and shape of your heart and how well your heart's chambers and valves are working. This procedure takes approximately one hour. There are no restrictions for this procedure. NOW AND AGAIN IN 1 YEAR PRIOR TO FOLLOW UP WITH DR [place]   Follow-Up: Your physician wants you to follow-up in: DR [facility] AFTER ECHO IN 1 YEAR You will receive a reminder letter in the mail two months in advance. If you don't receive a letter, please call our office to schedule the follow-up appointment.  Consider getting a Kardia Mobile device to check your heart rate.

## 2021-11-11 NOTE — Assessment & Plan Note (Signed)
Mild ascending aortic aneurysm.  This was stable on CT in 2021 and 2022.  We will get an echocardiogram to assess for stability.  Blood pressure is well-controlled.

## 2021-11-11 NOTE — Assessment & Plan Note (Signed)
He had an episode of palpitations while walking on the golf course.  Overall it sounds like this was an episode of intravascular volume depletion.  His heart rate was regular but a little faster than usual for him.  No evidence of atrial fibrillation.  He has not had any recurrent episodes and continues to be very physically active without symptoms.  Continue to monitor for now.  We did discuss getting a Kardia mobile device in the future.

## 2021-11-12 ENCOUNTER — Other Ambulatory Visit (HOSPITAL_BASED_OUTPATIENT_CLINIC_OR_DEPARTMENT_OTHER): Payer: Self-pay | Admitting: Family Medicine

## 2021-11-12 ENCOUNTER — Other Ambulatory Visit (HOSPITAL_COMMUNITY): Payer: Self-pay

## 2021-11-12 MED ORDER — ATORVASTATIN CALCIUM 40 MG PO TABS
40.0000 mg | ORAL_TABLET | Freq: Every day | ORAL | 0 refills | Status: DC
  Filled 2021-11-12: qty 90, 90d supply, fill #0

## 2021-11-16 DIAGNOSIS — L719 Rosacea, unspecified: Secondary | ICD-10-CM | POA: Diagnosis not present

## 2021-11-16 DIAGNOSIS — Z85828 Personal history of other malignant neoplasm of skin: Secondary | ICD-10-CM | POA: Diagnosis not present

## 2021-11-16 DIAGNOSIS — L814 Other melanin hyperpigmentation: Secondary | ICD-10-CM | POA: Diagnosis not present

## 2021-11-16 DIAGNOSIS — Z0100 Encounter for examination of eyes and vision without abnormal findings: Secondary | ICD-10-CM | POA: Diagnosis not present

## 2021-11-16 DIAGNOSIS — Z85820 Personal history of malignant melanoma of skin: Secondary | ICD-10-CM | POA: Diagnosis not present

## 2021-11-16 DIAGNOSIS — L821 Other seborrheic keratosis: Secondary | ICD-10-CM | POA: Diagnosis not present

## 2021-11-16 DIAGNOSIS — L820 Inflamed seborrheic keratosis: Secondary | ICD-10-CM | POA: Diagnosis not present

## 2021-11-16 DIAGNOSIS — D1801 Hemangioma of skin and subcutaneous tissue: Secondary | ICD-10-CM | POA: Diagnosis not present

## 2021-11-16 DIAGNOSIS — L578 Other skin changes due to chronic exposure to nonionizing radiation: Secondary | ICD-10-CM | POA: Diagnosis not present

## 2021-12-09 ENCOUNTER — Other Ambulatory Visit (HOSPITAL_BASED_OUTPATIENT_CLINIC_OR_DEPARTMENT_OTHER): Payer: Medicare HMO

## 2021-12-21 ENCOUNTER — Other Ambulatory Visit (HOSPITAL_COMMUNITY): Payer: Self-pay

## 2021-12-23 ENCOUNTER — Ambulatory Visit (INDEPENDENT_AMBULATORY_CARE_PROVIDER_SITE_OTHER): Payer: Medicare HMO

## 2021-12-23 DIAGNOSIS — I7121 Aneurysm of the ascending aorta, without rupture: Secondary | ICD-10-CM

## 2021-12-23 LAB — ECHOCARDIOGRAM COMPLETE
Area-P 1/2: 2.76 cm2
P 1/2 time: 262 msec
S' Lateral: 2.29 cm

## 2021-12-25 ENCOUNTER — Other Ambulatory Visit (HOSPITAL_BASED_OUTPATIENT_CLINIC_OR_DEPARTMENT_OTHER): Payer: Self-pay | Admitting: *Deleted

## 2021-12-25 DIAGNOSIS — I7121 Aneurysm of the ascending aorta, without rupture: Secondary | ICD-10-CM

## 2021-12-29 ENCOUNTER — Other Ambulatory Visit (HOSPITAL_BASED_OUTPATIENT_CLINIC_OR_DEPARTMENT_OTHER): Payer: Self-pay | Admitting: Family Medicine

## 2021-12-29 DIAGNOSIS — N400 Enlarged prostate without lower urinary tract symptoms: Secondary | ICD-10-CM

## 2021-12-29 DIAGNOSIS — E1169 Type 2 diabetes mellitus with other specified complication: Secondary | ICD-10-CM

## 2021-12-30 ENCOUNTER — Other Ambulatory Visit (HOSPITAL_COMMUNITY): Payer: Self-pay

## 2021-12-30 MED ORDER — METFORMIN HCL 1000 MG PO TABS
1000.0000 mg | ORAL_TABLET | Freq: Two times a day (BID) | ORAL | 0 refills | Status: DC
  Filled 2021-12-30: qty 180, 90d supply, fill #0

## 2021-12-30 MED ORDER — TAMSULOSIN HCL 0.4 MG PO CAPS
0.4000 mg | ORAL_CAPSULE | Freq: Every day | ORAL | 0 refills | Status: DC
  Filled 2021-12-30: qty 90, 90d supply, fill #0

## 2022-01-04 ENCOUNTER — Other Ambulatory Visit (HOSPITAL_BASED_OUTPATIENT_CLINIC_OR_DEPARTMENT_OTHER): Payer: Self-pay

## 2022-01-04 MED ORDER — INFLUENZA VAC A&B SA ADJ QUAD 0.5 ML IM PRSY
PREFILLED_SYRINGE | INTRAMUSCULAR | 0 refills | Status: DC
  Filled 2022-01-04: qty 0.5, 1d supply, fill #0

## 2022-01-07 ENCOUNTER — Other Ambulatory Visit (HOSPITAL_COMMUNITY): Payer: Self-pay

## 2022-01-25 ENCOUNTER — Ambulatory Visit (INDEPENDENT_AMBULATORY_CARE_PROVIDER_SITE_OTHER): Payer: Medicare HMO | Admitting: Family Medicine

## 2022-01-25 VITALS — BP 118/70 | HR 67 | Ht 74.5 in | Wt 244.8 lb

## 2022-01-25 DIAGNOSIS — E785 Hyperlipidemia, unspecified: Secondary | ICD-10-CM | POA: Diagnosis not present

## 2022-01-25 DIAGNOSIS — E119 Type 2 diabetes mellitus without complications: Secondary | ICD-10-CM | POA: Diagnosis not present

## 2022-01-25 DIAGNOSIS — M25512 Pain in left shoulder: Secondary | ICD-10-CM | POA: Insufficient documentation

## 2022-01-25 DIAGNOSIS — I10 Essential (primary) hypertension: Secondary | ICD-10-CM

## 2022-01-25 NOTE — Patient Instructions (Signed)
  Medication Instructions:  Your physician recommends that you continue on your current medications as directed. Please refer to the Current Medication list given to you today. --If you need a refill on any your medications before your next appointment, please call your pharmacy first. If no refills are authorized on file call the office.-- Lab Work: Your physician has recommended that you have lab work : Fasting labs sometime this week  If you have labs (blood work) drawn today and your tests are completely normal, you will receive your results via [place] a phone call from our staff.  Please ensure you check your voicemail in the event that you authorized detailed messages to be left on a delegated number. If you have any lab test that is abnormal or we need to change your treatment, we will call you to review the results.  Referrals/Procedures/Imaging:   Follow-Up: Your next appointment:   Your physician recommends that you schedule a follow-up appointment in: 3 - 4 months with Dr. [name]  You will receive a text message or e-mail with a link to a survey about your care and experience with [place] today! We would greatly appreciate your feedback!   Thanks for letting us be apart of your health journey!!  Primary Care and Sports Medicine   Dr. Arlina Robes [place]   We encourage you to activate your patient portal called "MyChart".  Sign up information is provided on this After Visit Summary.  MyChart is used to connect with patients for Virtual Visits (Telemedicine).  Patients are able to view lab/test results, encounter notes, upcoming appointments, etc.  Non-urgent messages can be sent to your provider as well. To learn more about what you can do with MyChart, please visit --  [email or web address].

## 2022-01-25 NOTE — Assessment & Plan Note (Signed)
Patient continues with metformin and Mounjaro.  Reports that he is tolerating medications well, denies any issues at this time.  Most recent hemoglobin A1c was at goal at 5.7%.  He additionally continues working on lifestyle modifications Foot exam completed today, documented in chart We will plan to update hemoglobin A1c with upcoming labs No changes to medications at this time

## 2022-01-25 NOTE — Assessment & Plan Note (Signed)
Blood pressure at goal in office today He does check his blood pressure intermittently at home, readings will vary, does report good control typically Previously, we did stop antihypertensive medication due to low blood pressure readings, improvement with lifestyle modifications.  More recently, he indicates starting half a tablet of his losartan that he was on previously, currently taking 50 mg dose.  He reports doing so due to findings from recent echocardiogram which indicated need for optimal blood pressure control.  He does indicate that he has been having occasional lightheadedness which tends to be when he stands from a laying or seated position. Given current blood pressure, feel that it would be reasonable to continue with lower dose of losartan.  Recommend close monitoring of blood pressure at home as well as monitoring for symptoms such as lightheadedness or dizziness which can be related to low blood pressure readings.  Discussed this with patient.  Recommend continue with lifestyle modifications including DASH diet, regular exercise Recommend follow-up with cardiology as well

## 2022-01-25 NOTE — Progress Notes (Signed)
Procedures performed today:    None.  Independent interpretation of notes and tests performed by another provider:   None.  Brief History, Exam, Impression, and Recommendations:    BP 118/70 (BP Location: Right Arm, Patient Position: Sitting, Cuff Size: Normal)   Pulse 67   Ht 6' 2.5" (1.892 m)   Wt 244 lb 12.8 oz (111 kg)   SpO2 98%   BMI 31.01 kg/m   Hypertension Blood pressure at goal in office today He does check his blood pressure intermittently at home, readings will vary, does report good control typically Previously, we did stop antihypertensive medication due to low blood pressure readings, improvement with lifestyle modifications.  More recently, he indicates starting half a tablet of his losartan that he was on previously, currently taking 50 mg dose.  He reports doing so due to findings from recent echocardiogram which indicated need for optimal blood pressure control.  He does indicate that he has been having occasional lightheadedness which tends to be when he stands from a laying or seated position. Given current blood pressure, feel that it would be reasonable to continue with lower dose of losartan.  Recommend close monitoring of blood pressure at home as well as monitoring for symptoms such as lightheadedness or dizziness which can be related to low blood pressure readings.  Discussed this with patient.  Recommend continue with lifestyle modifications including DASH diet, regular exercise Recommend follow-up with cardiology as well  Diabetes [facility]) Patient continues with metformin and Mounjaro.  Reports that he is tolerating medications well, denies any issues at this time.  Most recent hemoglobin A1c was at goal at 5.7%.  He additionally continues working on lifestyle modifications Foot exam completed today, documented in chart We will plan to update hemoglobin A1c with upcoming labs No changes to medications at this time  Dyslipidemia Patient continues with  atorvastatin, denies any issues with medication.  We will plan to check lipid panel to assess current status, patient will return to complete labs as fasting labs  Left shoulder pain Chronic intermittent issue for patient, has had slight worsening over the past few months.  Will note pain at times with certain activities/movements while exercising or engaging in daily activities.  Pain primarily over lateral aspect of shoulder.  He has not had any associated numbness or tingling.  No obvious weakness reported On exam, patient does not have any tenderness palpation about the shoulder. Left shoulder: Obvious swelling, bruising or erythema: absent Deformity of the shoulder: absent Active ROM: full range of motion Passive ROM: full range of motion Strength: normal/normal Empty can: Positive Hawkins: Positive, mild Neer's: Positive, mild at end of range of motion Neurovascular exam: intact Suspect potential impingement, did discuss possibility of rotator cuff etiology such as tendinopathy or partial tearing/fraying.  Do not suspect large frank rotator cuff tear.  Did discuss that even if rotator cuff pathology is present, would recommend proceeding with conservative measures initially including home exercise regimen, physical therapy.  At this time he will continue with home exercise regimen, handout provided today.  Did discuss that if he would like to proceed with referral to physical therapy, he may contact the office and we can do so at his preference If continuing to have pain, we can also look to obtain initial x-rays for evaluation.  Could also consider corticosteroid injection if symptoms persist despite above  Return in about 3 months (around 04/27/2022) for DM, HTN.   ___________________________________________ Kiamesha Samet [name], MD, ABFM, CAQSM Primary  Care and [place]

## 2022-01-25 NOTE — Assessment & Plan Note (Signed)
Chronic intermittent issue for patient, has had slight worsening over the past few months.  Will note pain at times with certain activities/movements while exercising or engaging in daily activities.  Pain primarily over lateral aspect of shoulder.  He has not had any associated numbness or tingling.  No obvious weakness reported On exam, patient does not have any tenderness palpation about the shoulder. Left shoulder: Obvious swelling, bruising or erythema: absent Deformity of the shoulder: absent Active ROM: full range of motion Passive ROM: full range of motion Strength: normal/normal Empty can: Positive Hawkins: Positive, mild Neer's: Positive, mild at end of range of motion Neurovascular exam: intact Suspect potential impingement, did discuss possibility of rotator cuff etiology such as tendinopathy or partial tearing/fraying.  Do not suspect large frank rotator cuff tear.  Did discuss that even if rotator cuff pathology is present, would recommend proceeding with conservative measures initially including home exercise regimen, physical therapy.  At this time he will continue with home exercise regimen, handout provided today.  Did discuss that if he would like to proceed with referral to physical therapy, he may contact the office and we can do so at his preference If continuing to have pain, we can also look to obtain initial x-rays for evaluation.  Could also consider corticosteroid injection if symptoms persist despite above

## 2022-01-25 NOTE — Assessment & Plan Note (Signed)
Patient continues with atorvastatin, denies any issues with medication.  We will plan to check lipid panel to assess current status, patient will return to complete labs as fasting labs

## 2022-01-26 ENCOUNTER — Ambulatory Visit (HOSPITAL_BASED_OUTPATIENT_CLINIC_OR_DEPARTMENT_OTHER): Payer: Medicare HMO

## 2022-02-02 ENCOUNTER — Other Ambulatory Visit (HOSPITAL_BASED_OUTPATIENT_CLINIC_OR_DEPARTMENT_OTHER): Payer: Self-pay

## 2022-02-02 ENCOUNTER — Other Ambulatory Visit (HOSPITAL_BASED_OUTPATIENT_CLINIC_OR_DEPARTMENT_OTHER): Payer: Self-pay | Admitting: Family Medicine

## 2022-02-02 ENCOUNTER — Other Ambulatory Visit (HOSPITAL_COMMUNITY): Payer: Self-pay

## 2022-02-02 DIAGNOSIS — E119 Type 2 diabetes mellitus without complications: Secondary | ICD-10-CM

## 2022-02-02 MED ORDER — MOUNJARO 5 MG/0.5ML ~~LOC~~ SOAJ
5.0000 mg | SUBCUTANEOUS | 1 refills | Status: DC
  Filled 2022-02-02: qty 6, 84d supply, fill #0
  Filled 2022-02-02: qty 2, 28d supply, fill #0
  Filled 2022-03-05: qty 2, 28d supply, fill #1
  Filled 2022-03-31: qty 2, 28d supply, fill #2
  Filled 2022-05-01: qty 2, 28d supply, fill #3
  Filled 2022-05-26: qty 2, 28d supply, fill #4
  Filled 2022-07-05: qty 2, 28d supply, fill #5

## 2022-02-13 ENCOUNTER — Other Ambulatory Visit (HOSPITAL_BASED_OUTPATIENT_CLINIC_OR_DEPARTMENT_OTHER): Payer: Self-pay | Admitting: Family Medicine

## 2022-02-15 ENCOUNTER — Other Ambulatory Visit (HOSPITAL_COMMUNITY): Payer: Self-pay

## 2022-02-16 ENCOUNTER — Other Ambulatory Visit (HOSPITAL_COMMUNITY): Payer: Self-pay

## 2022-02-16 MED ORDER — ATORVASTATIN CALCIUM 40 MG PO TABS
40.0000 mg | ORAL_TABLET | Freq: Every day | ORAL | 0 refills | Status: DC
  Filled 2022-02-16: qty 90, 90d supply, fill #0

## 2022-03-05 ENCOUNTER — Other Ambulatory Visit (HOSPITAL_COMMUNITY): Payer: Self-pay

## 2022-03-31 ENCOUNTER — Other Ambulatory Visit: Payer: Self-pay

## 2022-03-31 ENCOUNTER — Other Ambulatory Visit (HOSPITAL_COMMUNITY): Payer: Self-pay

## 2022-03-31 ENCOUNTER — Other Ambulatory Visit (HOSPITAL_BASED_OUTPATIENT_CLINIC_OR_DEPARTMENT_OTHER): Payer: Self-pay | Admitting: Family Medicine

## 2022-03-31 DIAGNOSIS — N400 Enlarged prostate without lower urinary tract symptoms: Secondary | ICD-10-CM

## 2022-03-31 DIAGNOSIS — E1169 Type 2 diabetes mellitus with other specified complication: Secondary | ICD-10-CM

## 2022-03-31 MED ORDER — METFORMIN HCL 1000 MG PO TABS
1000.0000 mg | ORAL_TABLET | Freq: Two times a day (BID) | ORAL | 0 refills | Status: DC
  Filled 2022-03-31: qty 180, 90d supply, fill #0

## 2022-03-31 MED ORDER — TAMSULOSIN HCL 0.4 MG PO CAPS
0.4000 mg | ORAL_CAPSULE | Freq: Every day | ORAL | 0 refills | Status: DC
  Filled 2022-03-31: qty 90, 90d supply, fill #0

## 2022-04-07 DIAGNOSIS — C44222 Squamous cell carcinoma of skin of right ear and external auricular canal: Secondary | ICD-10-CM | POA: Diagnosis not present

## 2022-04-07 DIAGNOSIS — D0439 Carcinoma in situ of skin of other parts of face: Secondary | ICD-10-CM | POA: Diagnosis not present

## 2022-04-07 DIAGNOSIS — L821 Other seborrheic keratosis: Secondary | ICD-10-CM | POA: Diagnosis not present

## 2022-04-07 DIAGNOSIS — D485 Neoplasm of uncertain behavior of skin: Secondary | ICD-10-CM | POA: Diagnosis not present

## 2022-04-13 ENCOUNTER — Other Ambulatory Visit (HOSPITAL_BASED_OUTPATIENT_CLINIC_OR_DEPARTMENT_OTHER): Payer: Self-pay

## 2022-04-13 MED ORDER — COMIRNATY 30 MCG/0.3ML IM SUSY
PREFILLED_SYRINGE | INTRAMUSCULAR | 0 refills | Status: DC
  Filled 2022-04-13: qty 0.3, 1d supply, fill #0

## 2022-04-27 DIAGNOSIS — C44329 Squamous cell carcinoma of skin of other parts of face: Secondary | ICD-10-CM | POA: Diagnosis not present

## 2022-04-29 ENCOUNTER — Ambulatory Visit (HOSPITAL_BASED_OUTPATIENT_CLINIC_OR_DEPARTMENT_OTHER): Payer: Medicare HMO | Admitting: Family Medicine

## 2022-05-11 ENCOUNTER — Other Ambulatory Visit (HOSPITAL_COMMUNITY): Payer: Self-pay

## 2022-05-18 ENCOUNTER — Other Ambulatory Visit: Payer: Self-pay

## 2022-05-18 ENCOUNTER — Other Ambulatory Visit (HOSPITAL_BASED_OUTPATIENT_CLINIC_OR_DEPARTMENT_OTHER): Payer: Self-pay | Admitting: Family Medicine

## 2022-05-18 ENCOUNTER — Other Ambulatory Visit (HOSPITAL_COMMUNITY): Payer: Self-pay

## 2022-05-18 MED ORDER — ATORVASTATIN CALCIUM 40 MG PO TABS
40.0000 mg | ORAL_TABLET | Freq: Every day | ORAL | 0 refills | Status: DC
  Filled 2022-05-18: qty 90, 90d supply, fill #0

## 2022-05-26 DIAGNOSIS — D0439 Carcinoma in situ of skin of other parts of face: Secondary | ICD-10-CM | POA: Diagnosis not present

## 2022-05-26 DIAGNOSIS — Z85828 Personal history of other malignant neoplasm of skin: Secondary | ICD-10-CM | POA: Diagnosis not present

## 2022-05-26 DIAGNOSIS — Z5189 Encounter for other specified aftercare: Secondary | ICD-10-CM | POA: Diagnosis not present

## 2022-06-01 DIAGNOSIS — D0439 Carcinoma in situ of skin of other parts of face: Secondary | ICD-10-CM | POA: Diagnosis not present

## 2022-06-10 DIAGNOSIS — H53143 Visual discomfort, bilateral: Secondary | ICD-10-CM | POA: Diagnosis not present

## 2022-06-10 DIAGNOSIS — H25013 Cortical age-related cataract, bilateral: Secondary | ICD-10-CM | POA: Diagnosis not present

## 2022-06-10 DIAGNOSIS — H52223 Regular astigmatism, bilateral: Secondary | ICD-10-CM | POA: Diagnosis not present

## 2022-06-10 DIAGNOSIS — H524 Presbyopia: Secondary | ICD-10-CM | POA: Diagnosis not present

## 2022-06-10 DIAGNOSIS — H53002 Unspecified amblyopia, left eye: Secondary | ICD-10-CM | POA: Diagnosis not present

## 2022-06-10 DIAGNOSIS — E119 Type 2 diabetes mellitus without complications: Secondary | ICD-10-CM | POA: Diagnosis not present

## 2022-06-10 DIAGNOSIS — H5203 Hypermetropia, bilateral: Secondary | ICD-10-CM | POA: Diagnosis not present

## 2022-06-10 DIAGNOSIS — H0011 Chalazion right upper eyelid: Secondary | ICD-10-CM | POA: Diagnosis not present

## 2022-06-17 ENCOUNTER — Encounter (HOSPITAL_BASED_OUTPATIENT_CLINIC_OR_DEPARTMENT_OTHER): Payer: Self-pay | Admitting: Family Medicine

## 2022-06-17 ENCOUNTER — Ambulatory Visit (INDEPENDENT_AMBULATORY_CARE_PROVIDER_SITE_OTHER): Payer: Medicare HMO | Admitting: Family Medicine

## 2022-06-17 VITALS — BP 97/63 | HR 65 | Ht 74.5 in | Wt 241.2 lb

## 2022-06-17 DIAGNOSIS — E119 Type 2 diabetes mellitus without complications: Secondary | ICD-10-CM | POA: Diagnosis not present

## 2022-06-17 DIAGNOSIS — N400 Enlarged prostate without lower urinary tract symptoms: Secondary | ICD-10-CM | POA: Diagnosis not present

## 2022-06-17 DIAGNOSIS — E785 Hyperlipidemia, unspecified: Secondary | ICD-10-CM

## 2022-06-17 DIAGNOSIS — I10 Essential (primary) hypertension: Secondary | ICD-10-CM | POA: Diagnosis not present

## 2022-06-17 NOTE — Assessment & Plan Note (Signed)
History of BPH, continues with finasteride and tamsulosin.  Denies any new symptoms or issues.  He is wanting to proceed with recheck of PSA as it has been a couple years for him.  We did discuss risks and limitations related to PSA testing and screening for prostate cancer.  Patient voiced understanding, elected to proceed with recheck of PSA

## 2022-06-17 NOTE — Assessment & Plan Note (Signed)
Patient is due for hemoglobin A1c check, most recent showed good control of blood sugars.  He continues with Mounjaro and metformin, denies any issues at this time.  No reported polyuria or polydipsia We will check hemoglobin A1c today and proceed accordingly in regards to medication management

## 2022-06-17 NOTE — Assessment & Plan Note (Signed)
Currently doing well with atorvastatin, denies any myalgias.  Is due for recheck of cholesterol panel, we will proceed with this in the near future, patient will return for fasting labs

## 2022-06-17 NOTE — Progress Notes (Signed)
    Procedures performed today:    None.  Independent interpretation of notes and tests performed by another provider:   None.  Brief History, Exam, Impression, and Recommendations:    BP 97/63 (BP Location: Left Arm, Patient Position: Sitting, Cuff Size: Large)   Pulse 65   Ht 6' 2.5" (1.892 m)   Wt 241 lb 3.2 oz (109.4 kg)   SpO2 100%   BMI 30.55 kg/m   Hypertension Blood pressure at goal in office today, slightly low in office today.  He denies any current issues related to lightheadedness or dizziness.  He does report that he has been taking full dose of losartan, previously we have switched to half a tablet.  He has not been able to be as active as normal given recent dermatologic procedures which led to him taking full dose at the present time. Discussed that as patient transitions back to normal activity, can proceed with return to half a tablet of losartan and close monitoring of blood pressure.  He is not currently having any symptoms related to low blood pressure.  If patient continues to do well in regards to blood pressure management and gradual weight loss, can consider further decreasing dose of antihypertensive in order to avoid potential risk of overtreatment/low blood pressures  Diabetes ([place]) Patient is due for hemoglobin A1c check, most recent showed good control of blood sugars.  He continues with Mounjaro and metformin, denies any issues at this time.  No reported polyuria or polydipsia We will check hemoglobin A1c today and proceed accordingly in regards to medication management  BPH (benign prostatic hyperplasia) History of BPH, continues with finasteride and tamsulosin.  Denies any new symptoms or issues.  He is wanting to proceed with recheck of PSA as it has been a couple years for him.  We did discuss risks and limitations related to PSA testing and screening for prostate cancer.  Patient voiced understanding, elected to proceed with recheck of  PSA  Dyslipidemia Currently doing well with atorvastatin, denies any myalgias.  Is due for recheck of cholesterol panel, we will proceed with this in the near future, patient will return for fasting labs  Return in about 3 months (around 09/17/2022) for CPE.   ___________________________________________ Conny Moening [name], MD, ABFM, CAQSM [facility] [place]

## 2022-06-17 NOTE — Assessment & Plan Note (Signed)
Blood pressure at goal in office today, slightly low in office today.  He denies any current issues related to lightheadedness or dizziness.  He does report that he has been taking full dose of losartan, previously we have switched to half a tablet.  He has not been able to be as active as normal given recent dermatologic procedures which led to him taking full dose at the present time. Discussed that as patient transitions back to normal activity, can proceed with return to half a tablet of losartan and close monitoring of blood pressure.  He is not currently having any symptoms related to low blood pressure.  If patient continues to do well in regards to blood pressure management and gradual weight loss, can consider further decreasing dose of antihypertensive in order to avoid potential risk of overtreatment/low blood pressures

## 2022-06-21 ENCOUNTER — Other Ambulatory Visit (HOSPITAL_BASED_OUTPATIENT_CLINIC_OR_DEPARTMENT_OTHER): Payer: Medicare HMO

## 2022-06-21 DIAGNOSIS — I10 Essential (primary) hypertension: Secondary | ICD-10-CM | POA: Diagnosis not present

## 2022-06-21 DIAGNOSIS — E119 Type 2 diabetes mellitus without complications: Secondary | ICD-10-CM | POA: Diagnosis not present

## 2022-06-21 DIAGNOSIS — E785 Hyperlipidemia, unspecified: Secondary | ICD-10-CM | POA: Diagnosis not present

## 2022-06-22 LAB — COMPREHENSIVE METABOLIC PANEL
ALT: 24 IU/L (ref 0–44)
AST: 26 IU/L (ref 0–40)
Albumin/Globulin Ratio: 2.2 (ref 1.2–2.2)
Albumin: 4.3 g/dL (ref 3.9–4.9)
Alkaline Phosphatase: 87 IU/L (ref 44–121)
BUN/Creatinine Ratio: 15 (ref 10–24)
BUN: 13 mg/dL (ref 8–27)
Bilirubin Total: 0.8 mg/dL (ref 0.0–1.2)
CO2: 25 mmol/L (ref 20–29)
Calcium: 9.8 mg/dL (ref 8.6–10.2)
Chloride: 101 mmol/L (ref 96–106)
Creatinine, Ser: 0.89 mg/dL (ref 0.76–1.27)
Globulin, Total: 2 g/dL (ref 1.5–4.5)
Glucose: 97 mg/dL (ref 70–99)
Potassium: 5.2 mmol/L (ref 3.5–5.2)
Sodium: 139 mmol/L (ref 134–144)
Total Protein: 6.3 g/dL (ref 6.0–8.5)
eGFR: 93 mL/min/{1.73_m2} (ref >59)

## 2022-06-22 LAB — LIPID PANEL
Chol/HDL Ratio: 1.9 ratio (ref 0.0–5.0)
Cholesterol, Total: 126 mg/dL (ref 100–199)
HDL: 65 mg/dL (ref >39)
LDL Chol Calc (NIH): 45 mg/dL (ref 0–99)
Triglycerides: 79 mg/dL (ref 0–149)
VLDL Cholesterol Cal: 16 mg/dL (ref 5–40)

## 2022-06-22 LAB — CBC WITH DIFFERENTIAL/PLATELET
Basophils Absolute: 0.1 10*3/uL (ref 0.0–0.2)
Basos: 1 %
EOS (ABSOLUTE): 0.1 10*3/uL (ref 0.0–0.4)
Eos: 2 %
Hematocrit: 39.9 % (ref 37.5–51.0)
Hemoglobin: 13.4 g/dL (ref 13.0–17.7)
Immature Grans (Abs): 0 10*3/uL (ref 0.0–0.1)
Immature Granulocytes: 1 %
Lymphocytes Absolute: 1.4 10*3/uL (ref 0.7–3.1)
Lymphs: 25 %
MCH: 31 pg (ref 26.6–33.0)
MCHC: 33.6 g/dL (ref 31.5–35.7)
MCV: 92 fL (ref 79–97)
Monocytes Absolute: 0.6 10*3/uL (ref 0.1–0.9)
Monocytes: 11 %
Neutrophils Absolute: 3.4 10*3/uL (ref 1.4–7.0)
Neutrophils: 60 %
Platelets: 219 10*3/uL (ref 150–450)
RBC: 4.32 x10E6/uL (ref 4.14–5.80)
RDW: 12.4 % (ref 11.6–15.4)
WBC: 5.7 10*3/uL (ref 3.4–10.8)

## 2022-06-22 LAB — HEMOGLOBIN A1C
Est. average glucose Bld gHb Est-mCnc: 120 mg/dL
Hgb A1c MFr Bld: 5.8 % — ABNORMAL HIGH (ref 4.8–5.6)

## 2022-06-23 DIAGNOSIS — Z85828 Personal history of other malignant neoplasm of skin: Secondary | ICD-10-CM | POA: Diagnosis not present

## 2022-06-23 DIAGNOSIS — Z5189 Encounter for other specified aftercare: Secondary | ICD-10-CM | POA: Diagnosis not present

## 2022-06-24 ENCOUNTER — Other Ambulatory Visit (HOSPITAL_BASED_OUTPATIENT_CLINIC_OR_DEPARTMENT_OTHER): Payer: Self-pay | Admitting: Family Medicine

## 2022-06-24 ENCOUNTER — Other Ambulatory Visit (HOSPITAL_COMMUNITY): Payer: Self-pay

## 2022-06-24 DIAGNOSIS — N400 Enlarged prostate without lower urinary tract symptoms: Secondary | ICD-10-CM

## 2022-06-24 MED ORDER — TAMSULOSIN HCL 0.4 MG PO CAPS
0.4000 mg | ORAL_CAPSULE | Freq: Every day | ORAL | 0 refills | Status: DC
  Filled 2022-06-24: qty 90, 90d supply, fill #0

## 2022-06-24 MED ORDER — LOSARTAN POTASSIUM 100 MG PO TABS
50.0000 mg | ORAL_TABLET | Freq: Every day | ORAL | 1 refills | Status: DC
  Filled 2022-06-24 – 2022-09-16 (×2): qty 45, 90d supply, fill #0
  Filled 2022-12-15: qty 45, 90d supply, fill #1

## 2022-06-25 ENCOUNTER — Ambulatory Visit (INDEPENDENT_AMBULATORY_CARE_PROVIDER_SITE_OTHER): Payer: Medicare HMO

## 2022-06-25 DIAGNOSIS — I7121 Aneurysm of the ascending aorta, without rupture: Secondary | ICD-10-CM | POA: Diagnosis not present

## 2022-06-25 LAB — ECHOCARDIOGRAM COMPLETE
AR max vel: 2.9 cm2
AV Area VTI: 3.07 cm2
AV Area mean vel: 3.21 cm2
AV Mean grad: 3 mmHg
AV Peak grad: 5.6 mmHg
AV Vena cont: 0.34 cm
Ao pk vel: 1.18 m/s
Area-P 1/2: 3.33 cm2
S' Lateral: 3.37 cm

## 2022-06-28 ENCOUNTER — Other Ambulatory Visit (HOSPITAL_COMMUNITY): Payer: Self-pay

## 2022-07-05 ENCOUNTER — Other Ambulatory Visit (HOSPITAL_COMMUNITY): Payer: Self-pay

## 2022-07-05 ENCOUNTER — Other Ambulatory Visit (HOSPITAL_BASED_OUTPATIENT_CLINIC_OR_DEPARTMENT_OTHER): Payer: Self-pay | Admitting: Family Medicine

## 2022-07-05 ENCOUNTER — Other Ambulatory Visit: Payer: Self-pay

## 2022-07-05 ENCOUNTER — Other Ambulatory Visit (HOSPITAL_BASED_OUTPATIENT_CLINIC_OR_DEPARTMENT_OTHER): Payer: Self-pay

## 2022-07-05 DIAGNOSIS — E1169 Type 2 diabetes mellitus with other specified complication: Secondary | ICD-10-CM

## 2022-07-05 DIAGNOSIS — N400 Enlarged prostate without lower urinary tract symptoms: Secondary | ICD-10-CM

## 2022-07-05 MED ORDER — FINASTERIDE 5 MG PO TABS
5.0000 mg | ORAL_TABLET | Freq: Every day | ORAL | 3 refills | Status: DC
  Filled 2022-07-05: qty 90, 365d supply, fill #0
  Filled 2022-07-07: qty 90, fill #0
  Filled 2022-08-13 – 2022-12-15 (×3): qty 90, 90d supply, fill #0
  Filled 2023-03-11 – 2023-03-14 (×2): qty 90, 90d supply, fill #1
  Filled 2023-06-14 – 2023-07-08 (×4): qty 90, 90d supply, fill #2

## 2022-07-05 MED ORDER — METFORMIN HCL 1000 MG PO TABS
1000.0000 mg | ORAL_TABLET | Freq: Two times a day (BID) | ORAL | 0 refills | Status: DC
  Filled 2022-07-05: qty 180, 90d supply, fill #0

## 2022-07-06 ENCOUNTER — Other Ambulatory Visit (HOSPITAL_COMMUNITY): Payer: Self-pay

## 2022-07-07 ENCOUNTER — Other Ambulatory Visit (HOSPITAL_BASED_OUTPATIENT_CLINIC_OR_DEPARTMENT_OTHER): Payer: Self-pay

## 2022-07-12 ENCOUNTER — Telehealth (HOSPITAL_BASED_OUTPATIENT_CLINIC_OR_DEPARTMENT_OTHER): Payer: Self-pay

## 2022-07-12 DIAGNOSIS — I7121 Aneurysm of the ascending aorta, without rupture: Secondary | ICD-10-CM

## 2022-07-12 NOTE — Telephone Encounter (Addendum)
Seen by patient Lance Davis on 07/11/2022  6:10 PM; repeat testing ordered    ----- Message from Alver Sorrow, NP sent at 07/11/2022  6:09 PM EDT ----- Echocardiogram with normal heart pumping function. Mild thickening and stiffness of heart muscle - we prevent this from worsening by keeping blood pressure well controlled. Mild dilation of aortic root 31mm and ascending aorta 9mm. Similar to previous. Recommend repeat echocardiogram in one year for monitoring.

## 2022-07-19 ENCOUNTER — Encounter: Payer: Self-pay | Admitting: *Deleted

## 2022-07-30 ENCOUNTER — Other Ambulatory Visit (HOSPITAL_BASED_OUTPATIENT_CLINIC_OR_DEPARTMENT_OTHER): Payer: Self-pay | Admitting: Family Medicine

## 2022-07-30 ENCOUNTER — Other Ambulatory Visit (HOSPITAL_COMMUNITY): Payer: Self-pay

## 2022-07-30 DIAGNOSIS — E119 Type 2 diabetes mellitus without complications: Secondary | ICD-10-CM

## 2022-07-30 MED ORDER — MOUNJARO 5 MG/0.5ML ~~LOC~~ SOAJ
5.0000 mg | SUBCUTANEOUS | 1 refills | Status: DC
Start: 2022-07-30 — End: 2023-02-15
  Filled 2022-07-30: qty 6, 84d supply, fill #0
  Filled 2022-08-12: qty 2, 28d supply, fill #0
  Filled 2022-09-14: qty 2, 28d supply, fill #1
  Filled 2022-10-12 – 2022-11-02 (×3): qty 2, 28d supply, fill #2
  Filled 2022-11-19 (×2): qty 2, 28d supply, fill #3
  Filled 2022-12-20: qty 2, 28d supply, fill #4
  Filled 2023-01-18: qty 2, 28d supply, fill #5

## 2022-08-05 ENCOUNTER — Other Ambulatory Visit (HOSPITAL_COMMUNITY): Payer: Self-pay

## 2022-08-11 DIAGNOSIS — Z85828 Personal history of other malignant neoplasm of skin: Secondary | ICD-10-CM | POA: Diagnosis not present

## 2022-08-11 DIAGNOSIS — L570 Actinic keratosis: Secondary | ICD-10-CM | POA: Diagnosis not present

## 2022-08-11 DIAGNOSIS — Z85820 Personal history of malignant melanoma of skin: Secondary | ICD-10-CM | POA: Diagnosis not present

## 2022-08-11 DIAGNOSIS — L814 Other melanin hyperpigmentation: Secondary | ICD-10-CM | POA: Diagnosis not present

## 2022-08-11 DIAGNOSIS — L821 Other seborrheic keratosis: Secondary | ICD-10-CM | POA: Diagnosis not present

## 2022-08-11 DIAGNOSIS — D1801 Hemangioma of skin and subcutaneous tissue: Secondary | ICD-10-CM | POA: Diagnosis not present

## 2022-08-11 DIAGNOSIS — L578 Other skin changes due to chronic exposure to nonionizing radiation: Secondary | ICD-10-CM | POA: Diagnosis not present

## 2022-08-12 ENCOUNTER — Other Ambulatory Visit (HOSPITAL_COMMUNITY): Payer: Self-pay

## 2022-08-13 ENCOUNTER — Other Ambulatory Visit (HOSPITAL_BASED_OUTPATIENT_CLINIC_OR_DEPARTMENT_OTHER): Payer: Self-pay | Admitting: Family Medicine

## 2022-08-13 ENCOUNTER — Other Ambulatory Visit: Payer: Self-pay

## 2022-08-13 ENCOUNTER — Other Ambulatory Visit (HOSPITAL_BASED_OUTPATIENT_CLINIC_OR_DEPARTMENT_OTHER): Payer: Self-pay

## 2022-08-13 DIAGNOSIS — E1169 Type 2 diabetes mellitus with other specified complication: Secondary | ICD-10-CM

## 2022-08-16 ENCOUNTER — Other Ambulatory Visit (HOSPITAL_BASED_OUTPATIENT_CLINIC_OR_DEPARTMENT_OTHER): Payer: Self-pay

## 2022-08-16 DIAGNOSIS — Z125 Encounter for screening for malignant neoplasm of prostate: Secondary | ICD-10-CM | POA: Diagnosis not present

## 2022-08-16 MED ORDER — METFORMIN HCL 1000 MG PO TABS
1000.0000 mg | ORAL_TABLET | Freq: Two times a day (BID) | ORAL | 0 refills | Status: DC
Start: 2022-08-16 — End: 2022-12-17
  Filled 2022-08-16 – 2022-09-19 (×2): qty 180, 90d supply, fill #0

## 2022-08-21 ENCOUNTER — Other Ambulatory Visit (HOSPITAL_BASED_OUTPATIENT_CLINIC_OR_DEPARTMENT_OTHER): Payer: Self-pay

## 2022-08-23 ENCOUNTER — Other Ambulatory Visit (HOSPITAL_BASED_OUTPATIENT_CLINIC_OR_DEPARTMENT_OTHER): Payer: Self-pay

## 2022-08-23 DIAGNOSIS — Z125 Encounter for screening for malignant neoplasm of prostate: Secondary | ICD-10-CM | POA: Diagnosis not present

## 2022-08-23 DIAGNOSIS — R351 Nocturia: Secondary | ICD-10-CM | POA: Diagnosis not present

## 2022-08-23 DIAGNOSIS — R350 Frequency of micturition: Secondary | ICD-10-CM | POA: Diagnosis not present

## 2022-08-23 DIAGNOSIS — N401 Enlarged prostate with lower urinary tract symptoms: Secondary | ICD-10-CM | POA: Diagnosis not present

## 2022-08-23 DIAGNOSIS — N5201 Erectile dysfunction due to arterial insufficiency: Secondary | ICD-10-CM | POA: Diagnosis not present

## 2022-08-23 MED ORDER — TAMSULOSIN HCL 0.4 MG PO CAPS
0.4000 mg | ORAL_CAPSULE | Freq: Every day | ORAL | 3 refills | Status: DC
  Filled 2022-08-23 – 2022-12-17 (×3): qty 90, 90d supply, fill #0
  Filled 2023-03-11: qty 90, 90d supply, fill #1
  Filled 2023-03-14: qty 90, 90d supply, fill #0
  Filled 2023-03-14: qty 90, 90d supply, fill #1

## 2022-08-23 MED ORDER — FINASTERIDE 5 MG PO TABS
5.0000 mg | ORAL_TABLET | Freq: Every day | ORAL | 3 refills | Status: DC
  Filled 2022-08-23: qty 90, 90d supply, fill #0
  Filled 2022-11-19 (×2): qty 30, 30d supply, fill #0

## 2022-08-24 ENCOUNTER — Other Ambulatory Visit (HOSPITAL_COMMUNITY): Payer: Self-pay

## 2022-08-27 ENCOUNTER — Other Ambulatory Visit (HOSPITAL_BASED_OUTPATIENT_CLINIC_OR_DEPARTMENT_OTHER): Payer: Self-pay

## 2022-09-11 DIAGNOSIS — Z0100 Encounter for examination of eyes and vision without abnormal findings: Secondary | ICD-10-CM | POA: Diagnosis not present

## 2022-09-14 ENCOUNTER — Other Ambulatory Visit (HOSPITAL_COMMUNITY): Payer: Self-pay

## 2022-09-19 ENCOUNTER — Other Ambulatory Visit (HOSPITAL_BASED_OUTPATIENT_CLINIC_OR_DEPARTMENT_OTHER): Payer: Self-pay | Admitting: Family Medicine

## 2022-09-20 ENCOUNTER — Other Ambulatory Visit: Payer: Self-pay

## 2022-09-20 ENCOUNTER — Other Ambulatory Visit (HOSPITAL_COMMUNITY): Payer: Self-pay

## 2022-09-20 MED ORDER — ATORVASTATIN CALCIUM 40 MG PO TABS
40.0000 mg | ORAL_TABLET | Freq: Every day | ORAL | 0 refills | Status: DC
  Filled 2022-09-20: qty 90, 90d supply, fill #0

## 2022-09-21 ENCOUNTER — Encounter (HOSPITAL_BASED_OUTPATIENT_CLINIC_OR_DEPARTMENT_OTHER): Payer: Medicare HMO | Admitting: Family Medicine

## 2022-09-29 ENCOUNTER — Encounter (HOSPITAL_BASED_OUTPATIENT_CLINIC_OR_DEPARTMENT_OTHER): Payer: Self-pay | Admitting: Family Medicine

## 2022-09-29 ENCOUNTER — Ambulatory Visit (INDEPENDENT_AMBULATORY_CARE_PROVIDER_SITE_OTHER): Payer: Medicare HMO | Admitting: Family Medicine

## 2022-09-29 VITALS — BP 114/74 | HR 68 | Temp 97.7°F | Ht 74.5 in | Wt 237.0 lb

## 2022-09-29 DIAGNOSIS — Z0000 Encounter for general adult medical examination without abnormal findings: Secondary | ICD-10-CM | POA: Diagnosis not present

## 2022-09-29 DIAGNOSIS — E119 Type 2 diabetes mellitus without complications: Secondary | ICD-10-CM | POA: Diagnosis not present

## 2022-09-29 DIAGNOSIS — G4739 Other sleep apnea: Secondary | ICD-10-CM | POA: Insufficient documentation

## 2022-09-29 DIAGNOSIS — G4730 Sleep apnea, unspecified: Secondary | ICD-10-CM | POA: Insufficient documentation

## 2022-09-29 NOTE — Progress Notes (Signed)
Subjective:    CC: Annual Physical Exam  HPI:  Lance Davis is a 67 y.o. presenting for annual physical  I reviewed the past medical history, family history, social history, surgical history, and allergies today and no changes were needed.  Please see the problem list section below in epic for further details.  Past Medical History: Past Medical History:  Diagnosis Date   Allergy    spring time   Aortic atherosclerosis (HCC) 11/11/2021   Arthritis    Atypical nevus 04/26/2016   mild - right mid back   Blood transfusion 1963   Cancer (HCC) 2019   melanoma face   Clark level II melanoma (HCC) 12/13/2017   left inner cheek   Palpitations 11/11/2021   Sleep apnea    cpap   Squamous cell carcinoma in situ of skin 04/26/2016   inner left cheek   Past Surgical History: Past Surgical History:  Procedure Laterality Date   COLONOSCOPY     I & D EXTREMITY  07/14/2011   Procedure: IRRIGATION AND DEBRIDEMENT EXTREMITY;  Surgeon: Sharma Covert, MD;  Location: [facility] OR;  Service: Orthopedics;  Laterality: Left;   POLYPECTOMY     TENDON REPAIR  07/14/2011   Procedure: TENDON REPAIR;  Surgeon: Sharma Covert, MD;  Location: [facility] OR;  Service: Orthopedics;  Laterality: Left;   Social History: Social History   Socioeconomic History   Marital status: Married    Spouse name: Not on file   Number of children: Not on file   Years of education: Not on file   Highest education level: Not on file  Occupational History   Not on file  Tobacco Use   Smoking status: Never   Smokeless tobacco: Never  Vaping Use   Vaping Use: Never used  Substance and Sexual Activity   Alcohol use: Yes    Alcohol/week: 14.0 standard drinks of alcohol    Types: 14 Glasses of wine per week   Drug use: No   Sexual activity: Yes  Other Topics Concern   Not on file  Social History Narrative   Not on file   Social Determinants of Health   Financial Resource Strain: Low Risk  (09/04/2021)   Overall Financial  Resource Strain (CARDIA)    Difficulty of Paying Living Expenses: Not hard at all  Food Insecurity: No Food Insecurity (09/04/2021)   Hunger Vital Sign    Worried About Running Out of Food in the Last Year: Never true    Ran Out of Food in the Last Year: Never true  Transportation Needs: No Transportation Needs (09/04/2021)   PRAPARE - Administrator, Civil Service (Medical): No    Lack of Transportation (Non-Medical): No  Physical Activity: Sufficiently Active (09/04/2021)   Exercise Vital Sign    Days of Exercise per Week: 5 days    Minutes of Exercise per Session: 50 min  Stress: No Stress Concern Present (09/04/2021)   [employer] of Occupational Health - Occupational Stress Questionnaire    Feeling of Stress : Only a little  Social Connections: Moderately Integrated (09/04/2021)   Social Connection and Isolation Panel [NHANES]    Frequency of Communication with Friends and Family: More than three times a week    Frequency of Social Gatherings with Friends and Family: More than three times a week    Attends Religious Services: Never    Database administrator or Organizations: Yes    Attends Banker Meetings: More than  4 times per year    Marital Status: Married   Family History: Family History  Problem Relation Age of Onset   Diabetes Mother    Heart disease Mother    CAD Mother    Frontotemporal dementia Father    Valvular heart disease Father    Diabetes Sister    Epilepsy Sister    Diabetes Brother    Valvular heart disease Maternal Uncle    Colon cancer Neg Hx    Colon polyps Neg Hx    Esophageal cancer Neg Hx    Stomach cancer Neg Hx    Rectal cancer Neg Hx    Allergies: No Known Allergies Medications: See med rec.  Review of Systems: No headache, visual changes, nausea, vomiting, diarrhea, constipation, dizziness, abdominal pain, skin rash, fevers, chills, night sweats, swollen lymph nodes, weight loss, chest pain, body aches, joint  swelling, muscle aches, shortness of breath, mood changes, visual or auditory hallucinations.  Objective:    BP 114/74 (BP Location: Right Arm, Patient Position: Sitting, Cuff Size: Normal)   Pulse 68   Temp 97.7 F (36.5 C) (Oral)   Ht 6' 2.5" (1.892 m)   Wt 237 lb (107.5 kg)   SpO2 100%   BMI 30.02 kg/m   General: Well Developed, well nourished, and in no acute distress. Neuro: Alert and oriented x3, extra-ocular muscles intact, sensation grossly intact. Cranial nerves II through XII are intact, motor, sensory, and coordinative functions are all intact. HEENT: Normocephalic, atraumatic, pupils equal round reactive to light, neck supple, no masses, no lymphadenopathy, thyroid nonpalpable. Oropharynx, nasopharynx, external ear canals are unremarkable. Skin: Warm and dry, no rashes noted. Cardiac: Regular rate and rhythm, no murmurs rubs or gallops. Respiratory: Clear to auscultation bilaterally. Not using accessory muscles, speaking in full sentences. Abdominal: Soft, nontender, nondistended, positive bowel sounds, no masses, no organomegaly. Musculoskeletal: Shoulder, elbow, wrist, hip, knee, ankle stable, and with full range of motion.  Impression and Recommendations:    Wellness examination Assessment & Plan: Routine HCM labs reviewed. HCM reviewed/discussed. Anticipatory guidance regarding healthy weight, lifestyle and choices given. Recommend healthy diet.  Recommend approximately 150 minutes/week of moderate intensity exercise Recommend regular dental and vision exams Always use seatbelt/lap and shoulder restraints Recommend using smoke alarms and checking batteries at least twice a year Recommend using sunscreen when outside Discussed colon cancer screening recommendations.  Patient up-to-date currently Discussed recommendations for shingles vaccine.  Patient has completed series. Discussed immunization recommendations - patient states that he believes he had pneumonia  vaccine since turning 64 and he will look for records to provide [place] regarding this PSA followed with urology   Type 2 diabetes mellitus without complication, without long-term current use of insulin (HCC) Assessment & Plan: Feels that he has been having some side effects Mounjaro, primarily related to decreased interest in food, some GI symptoms.  He plans to continue with [facility], however they want to consider other injectable options or other medications entirely.  We will continue to follow up on this at future visits  Orders: -     Microalbumin / creatinine urine ratio -     Hemoglobin A1c  Sleep apnea, unspecified type Assessment & Plan: Patient with known sleep apnea, was following with sleep medicine specialist, however they have retired.  He is interested in establishing with new sleep medicine provider locally, we will provide referral for patient to establish with pulmonology here  Orders: -     Ambulatory referral to Pulmonology  Return in about  3 months (around 12/30/2022) for hypertension, diabetes. Patient did also mention occasional bilateral hip pain which he feels does affect his sleep at night as he will have increased pain with lying on his side.  Discussed that we can further review this at next office visit or sooner as desired, can schedule appointment sooner if needed   ___________________________________________ Griffin Gerrard de [place], MD, ABFM, CAQSM Primary Care and Sports Medicine [facility]

## 2022-09-29 NOTE — Patient Instructions (Signed)
  Medication Instructions:  Your physician recommends that you continue on your current medications as directed. Please refer to the Current Medication list given to you today. --If you need a refill on any your medications before your next appointment, please call your pharmacy first. If no refills are authorized on file call the office.-- Lab Work: Your physician has recommended that you have lab work today: Yes If you have labs (blood work) drawn today and your tests are completely normal, you will receive your results via MyChart message OR a phone call from our staff.  Please ensure you check your voicemail in the event that you authorized detailed messages to be left on a delegated number. If you have any lab test that is abnormal or we need to change your treatment, we will call you to review the results.  Referrals/Procedures/Imaging: Yes  Follow-Up: Your next appointment:   Your physician recommends that you schedule a follow-up appointment in: 3 months with Dr. de [place]  You will receive a text message or e-mail with a link to a survey about your care and experience with us today! We would greatly appreciate your feedback!   Thanks for letting us be apart of your health journey!!  Primary Care and Sports Medicine   Dr. Raymond de [place]   We encourage you to activate your patient portal called "MyChart".  Sign up information is provided on this After Visit Summary.  MyChart is used to connect with patients for Virtual Visits (Telemedicine).  Patients are able to view lab/test results, encounter notes, upcoming appointments, etc.  Non-urgent messages can be sent to your provider as well. To learn more about what you can do with MyChart, please visit --  [email or web address].    

## 2022-09-29 NOTE — Assessment & Plan Note (Signed)
Patient with known sleep apnea, was following with sleep medicine specialist, however they have retired.  He is interested in establishing with new sleep medicine provider locally, we will provide referral for patient to establish with pulmonology here

## 2022-09-29 NOTE — Assessment & Plan Note (Addendum)
Routine HCM labs reviewed. HCM reviewed/discussed. Anticipatory guidance regarding healthy weight, lifestyle and choices given. Recommend healthy diet.  Recommend approximately 150 minutes/week of moderate intensity exercise Recommend regular dental and vision exams Always use seatbelt/lap and shoulder restraints Recommend using smoke alarms and checking batteries at least twice a year Recommend using sunscreen when outside Discussed colon cancer screening recommendations.  Patient up-to-date currently Discussed recommendations for shingles vaccine.  Patient has completed series. Discussed immunization recommendations - patient states that he believes he had pneumonia vaccine since turning 64 and he will look for records to provide [place] regarding this PSA followed with urology

## 2022-09-29 NOTE — Assessment & Plan Note (Signed)
Feels that he has been having some side effects Mounjaro, primarily related to decreased interest in food, some GI symptoms.  He plans to continue with [facility], however they want to consider other injectable options or other medications entirely.  We will continue to follow up on this at future visits

## 2022-09-30 LAB — HEMOGLOBIN A1C
Est. average glucose Bld gHb Est-mCnc: 123 mg/dL
Hgb A1c MFr Bld: 5.9 % — ABNORMAL HIGH (ref 4.8–5.6)

## 2022-09-30 LAB — MICROALBUMIN / CREATININE URINE RATIO
Creatinine, Urine: 87.8 mg/dL
Microalb/Creat Ratio: 4 mg/g creat (ref 0–29)
Microalbumin, Urine: 3.3 ug/mL

## 2022-10-04 ENCOUNTER — Encounter (HOSPITAL_BASED_OUTPATIENT_CLINIC_OR_DEPARTMENT_OTHER): Payer: Medicare HMO | Admitting: Family Medicine

## 2022-10-12 ENCOUNTER — Other Ambulatory Visit: Payer: Self-pay

## 2022-10-21 ENCOUNTER — Other Ambulatory Visit (HOSPITAL_COMMUNITY): Payer: Self-pay

## 2022-10-22 ENCOUNTER — Other Ambulatory Visit (HOSPITAL_COMMUNITY): Payer: Self-pay

## 2022-11-01 ENCOUNTER — Other Ambulatory Visit (HOSPITAL_COMMUNITY): Payer: Self-pay

## 2022-11-01 ENCOUNTER — Encounter (HOSPITAL_BASED_OUTPATIENT_CLINIC_OR_DEPARTMENT_OTHER): Payer: Self-pay | Admitting: Nurse Practitioner

## 2022-11-01 ENCOUNTER — Ambulatory Visit (INDEPENDENT_AMBULATORY_CARE_PROVIDER_SITE_OTHER): Payer: Medicare HMO | Admitting: Nurse Practitioner

## 2022-11-01 ENCOUNTER — Other Ambulatory Visit: Payer: Self-pay

## 2022-11-01 VITALS — BP 114/74 | HR 77 | Resp 12 | Ht 74.5 in | Wt 237.0 lb

## 2022-11-01 DIAGNOSIS — G4700 Insomnia, unspecified: Secondary | ICD-10-CM | POA: Insufficient documentation

## 2022-11-01 DIAGNOSIS — F5104 Psychophysiologic insomnia: Secondary | ICD-10-CM | POA: Insufficient documentation

## 2022-11-01 DIAGNOSIS — G4739 Other sleep apnea: Secondary | ICD-10-CM

## 2022-11-01 DIAGNOSIS — G4730 Sleep apnea, unspecified: Secondary | ICD-10-CM

## 2022-11-01 MED ORDER — ZOLPIDEM TARTRATE 5 MG PO TABS
5.0000 mg | ORAL_TABLET | Freq: Every evening | ORAL | 5 refills | Status: DC | PRN
Start: 2022-11-01 — End: 2023-04-21
  Filled 2022-11-01: qty 30, 30d supply, fill #0
  Filled 2023-01-19: qty 30, 30d supply, fill #1
  Filled 2023-03-11: qty 30, 30d supply, fill #0

## 2022-11-01 NOTE — Progress Notes (Signed)
@Patient  ID: Lance Davis, male    DOB: Nov 30, 1955, 67 y.o.   MRN: [id number]  Chief Complaint  Patient presents with   New Patient (Initial Visit)    Pt needs a new cpap machine and supplies  and patient hasn't a sleep done in about 15 years ago.     Referring provider: de [place], Buren Kos, MD  HPI: 67 year old male, never smoker referred for sleep consult. Past medical history significant for severe complex sleep apnea on CPAP, HTN, thoracic aneurysm, BPH, HLD.   TEST/EVENTS:  11/16/2011 PSG: AHI 35.6/h. Mixed central and obstructive. Sleep markedly fragmented throughout the night except interval between 1:30-2:30 am.   11/01/2022: Today - sleep consult  Patient presents today for sleep consult, referred by Dr. De [place]. He has a long history of sleep apnea. Last sleep study 2013 with severe sleep apnea, mixed central and obstructive events.  He wears his CPAP nightly.  Does not feel like he could sleep without it.  Receives benefit from using it.  Wakes feeling rested most mornings.  He does still have some difficulties with sleep onset.  Sometimes it can take him an hour or longer to fall asleep but other nights he does not have as much trouble.  He has tried melatonin which has been relatively unsuccessful for him.  Denies any morning headaches, drowsy driving, sleep parasomnia/paralysis. Goes to bed between 10 PM and midnight.  Sleep latency varies.  Wakes 1-4 times a night.  Usually gets up around 6 to 8 AM.  His weight is down 50 pounds over the last 2 years.  Last sleep study was in 2013.  He wears a nasal mask.  Curious if there are any other alternative nasal mask options that he could wear that would not go over the bridge of his nose.  Feels like his pressures are set appropriately.  No significant leaks.  Does not wear any supplemental oxygen.  Download shows excellent compliance with 100% greater than 4 hours of use and with residual AHI 0.4. He has a history of high blood  pressure and diabetes.  No history of stroke.  He is a never smoker.  Drinks 1-3 alcoholic beverages 5 nights a week.  No current sleep aids.  No excessive caffeine intake.  Lives with his spouse.  He is retired.  Does operate a motor vehicle but no other heavy machinery.  Family history of heart disease.  Epworth 4   No Known Allergies  Immunization History  Administered Date(s) Administered   COVID-19, mRNA, vaccine(Comirnaty)12 years and older 04/14/2022   Fluad Quad(high Dose 65+) 01/04/2022   Moderna SARS-COV2 Booster Vaccination 10/08/2020   PFIZER(Purple Top)SARS-COV-2 Vaccination 01/11/2020   Pfizer Covid-19 Vaccine Bivalent Booster 73yrs & up 12/15/2020   Tetanus 04/05/2014   Zoster Recombinant(Shingrix) 03/30/2017, 07/01/2017    Past Medical History:  Diagnosis Date   Allergy    spring time   Aortic atherosclerosis (HCC) 11/11/2021   Arthritis    Atypical nevus 04/26/2016   mild - right mid back   Blood transfusion 1963   Cancer (HCC) 2019   melanoma face   Clark level II melanoma (HCC) 12/13/2017   left inner cheek   Palpitations 11/11/2021   Sleep apnea    cpap   Squamous cell carcinoma in situ of skin 04/26/2016   inner left cheek    Tobacco History: Social History   Tobacco Use  Smoking Status Never  Smokeless Tobacco Never   Counseling given:  Not Answered   Outpatient Medications Prior to Visit  Medication Sig Dispense Refill   atorvastatin (LIPITOR) 40 MG tablet Take 1 tablet (40 mg total) by mouth daily. 90 tablet 0   finasteride (PROSCAR) 5 MG tablet Take 1 tablet (5 mg total) by mouth daily. 90 tablet 3   finasteride (PROSCAR) 5 MG tablet Take 1 tablet by mouth once daily. 90 tablet 3   losartan (COZAAR) 100 MG tablet Take 1/2 tablet (50 mg total) by mouth daily. 90 tablet 1   metFORMIN (GLUCOPHAGE) 1000 MG tablet Take 1 tablet (1,000 mg total) by mouth 2 (two) times daily with a meal. 180 tablet 0   tamsulosin (FLOMAX) 0.4 MG CAPS capsule Take 1  capsule (0.4 mg total) by mouth daily. 90 capsule 3   tirzepatide (MOUNJARO) 5 MG/0.5ML Pen Inject 5 mg into the skin once a week. 6 mL 1   No facility-administered medications prior to visit.     Review of Systems:   Constitutional: No night sweats, fevers, chills, or lassitude. +occasional fatigue, intentional weight loss HEENT: No headaches, difficulty swallowing, tooth/dental problems, or sore throat. No sneezing, itching, ear ache +occasional nasal congestion (baseline) CV:  No chest pain, orthopnea, PND, swelling in lower extremities, anasarca, dizziness, palpitations, syncope Resp: No shortness of breath with exertion or at rest. No excess mucus or change in color of mucus. No productive or non-productive. No hemoptysis. No wheezing.  No chest wall deformity GI:  No heartburn, indigestion GU: No nocturia Skin: No rash, lesions, ulcerations MSK:  No joint pain or swelling.   Neuro: No dizziness or lightheadedness.  Psych: No depression or anxiety. Mood stable. +sleep disturbance    Physical Exam:  BP 114/74   Pulse 77   Resp 12   Ht 6' 2.5" (1.892 m)   Wt 237 lb (107.5 kg)   SpO2 98%   BMI 30.02 kg/m   GEN: Pleasant, interactive, well-appearing; obese; in no acute distress. HEENT:  Normocephalic and atraumatic. PERRLA. Sclera white. Nasal turbinates pink, moist and patent bilaterally. No rhinorrhea present. Oropharynx pink and moist, without exudate or edema. No lesions, ulcerations, or postnasal drip. Mallampati III NECK:  Supple w/ fair ROM. No JVD present. Normal carotid impulses w/o bruits. Thyroid symmetrical with no goiter or nodules palpated. No lymphadenopathy.   CV: RRR, no m/r/g, no peripheral edema. Pulses intact, +2 bilaterally. No cyanosis, pallor or clubbing. PULMONARY:  Unlabored, regular breathing. Clear bilaterally A&P w/o wheezes/rales/rhonchi. No accessory muscle use.  GI: BS present and normoactive. Soft, non-tender to palpation. No organomegaly or  masses detected.  MSK: No erythema, warmth or tenderness. Cap refil <2 sec all extrem. No deformities or joint swelling noted.  Neuro: A/Ox3. No focal deficits noted.   Skin: Warm, no lesions or rashe Psych: Normal affect and behavior. Judgement and thought content appropriate.     Lab Results:  CBC    Component Value Date/Time   WBC 5.7 06/21/2022 0931   RBC 4.32 06/21/2022 0931   HGB 13.4 06/21/2022 0931   HCT 39.9 06/21/2022 0931   PLT 219 06/21/2022 0931   MCV 92 06/21/2022 0931   MCH 31.0 06/21/2022 0931   MCHC 33.6 06/21/2022 0931   RDW 12.4 06/21/2022 0931   LYMPHSABS 1.4 06/21/2022 0931   EOSABS 0.1 06/21/2022 0931   BASOSABS 0.1 06/21/2022 0931    BMET    Component Value Date/Time   NA 139 06/21/2022 0931   K 5.2 06/21/2022 0931   CL 101 06/21/2022 0931  CO2 25 06/21/2022 0931   GLUCOSE 97 06/21/2022 0931   BUN 13 06/21/2022 0931   CREATININE 0.89 06/21/2022 0931   CALCIUM 9.8 06/21/2022 0931    BNP No results found for: "BNP"   Imaging:  No results found.  Administration History     None           No data to display          No results found for: "NITRICOXIDE"      Assessment & Plan:   Mixed sleep apnea Severe mixed obstructive and central sleep apnea. Excellent compliance and control on CPAP. He was previously with [facility] but was supposed to change to [facility] due to insurance. Has not heard from them. Needing new supplies - orders sent today. Will have him trial nasal pillow mask. Receives benefit from use. Aware of risks of untreated OSA. Understands safe driving practices.  Patient Instructions  Continue to use CPAP every night, minimum of 4-6 hours a night.  Change equipment every 30 days or as directed by DME. Wash your tubing with warm soap and water daily, hang to dry. Wash humidifier portion weekly. Use bottled, distilled water and change daily  Be aware of reduced alertness and do not drive or operate heavy  machinery if experiencing this or drowsiness.  Notify if persistent daytime sleepiness occurs even with consistent use of CPAP.  We will send orders for new supplies with nasal pillow mask  Your supplies will come from [facility]. This is who [employer] requires you use  We discussed how untreated sleep apnea puts an individual at risk for cardiac arrhthymias, pulm HTN, DM, stroke and increases their risk for daytime accidents.  Start ambien 5 mg At bedtime as needed for sleep. Take within 30 minutes of intended sleep onset, with plans for 7-8 hours of sleep. If the 5 mg does not help you fall asleep and stay asleep, let me know and we can adjust dosing. Do not drive after taking. Stop and notify if any abnormal sleep habits or mood changes develop   Follow up in 6 weeks with Dr. Wynona Neat or Philis Nettle, or sooner, if needed    Chronic insomnia Primarily issues with sleep latency but does have some difficulties with maintenance. Sleep apnea well managed. Will trial him on pharmacological therapy with ambien. Medication side effect profile and proper use reviewed. Verbalized understanding. Sleep hygiene reviewed. Aware to not drive after taking sedating medications and to monitor for changes in sleep habits/mood.   I spent 45 minutes of dedicated to the care of this patient on the date of this encounter to include pre-visit review of records, face-to-face time with the patient discussing conditions above, post visit ordering of testing, clinical documentation with the electronic health record, making appropriate referrals as documented, and communicating necessary findings to members of the patients care team.  Noemi Chapel, NP 11/01/2022  Pt aware and understands NP's role.

## 2022-11-01 NOTE — Assessment & Plan Note (Signed)
Primarily issues with sleep latency but does have some difficulties with maintenance. Sleep apnea well managed. Will trial him on pharmacological therapy with ambien. Medication side effect profile and proper use reviewed. Verbalized understanding. Sleep hygiene reviewed. Aware to not drive after taking sedating medications and to monitor for changes in sleep habits/mood.

## 2022-11-01 NOTE — Assessment & Plan Note (Signed)
Severe mixed obstructive and central sleep apnea. Excellent compliance and control on CPAP. He was previously with [facility] but was supposed to change to [facility] due to insurance. Has not heard from them. Needing new supplies - orders sent today. Will have him trial nasal pillow mask. Receives benefit from use. Aware of risks of untreated OSA. Understands safe driving practices.  Patient Instructions  Continue to use CPAP every night, minimum of 4-6 hours a night.  Change equipment every 30 days or as directed by DME. Wash your tubing with warm soap and water daily, hang to dry. Wash humidifier portion weekly. Use bottled, distilled water and change daily  Be aware of reduced alertness and do not drive or operate heavy machinery if experiencing this or drowsiness.  Notify if persistent daytime sleepiness occurs even with consistent use of CPAP.  We will send orders for new supplies with nasal pillow mask  Your supplies will come from [facility]. This is who [employer] requires you use  We discussed how untreated sleep apnea puts an individual at risk for cardiac arrhthymias, pulm HTN, DM, stroke and increases their risk for daytime accidents.  Start ambien 5 mg At bedtime as needed for sleep. Take within 30 minutes of intended sleep onset, with plans for 7-8 hours of sleep. If the 5 mg does not help you fall asleep and stay asleep, let me know and we can adjust dosing. Do not drive after taking. Stop and notify if any abnormal sleep habits or mood changes develop   Follow up in 6 weeks with Dr. Wynona Neat or Philis Nettle, or sooner, if needed

## 2022-11-01 NOTE — Patient Instructions (Addendum)
Continue to use CPAP every night, minimum of 4-6 hours a night.  Change equipment every 30 days or as directed by DME. Wash your tubing with warm soap and water daily, hang to dry. Wash humidifier portion weekly. Use bottled, distilled water and change daily  Be aware of reduced alertness and do not drive or operate heavy machinery if experiencing this or drowsiness.  Notify if persistent daytime sleepiness occurs even with consistent use of CPAP.  We will send orders for new supplies with nasal pillow mask  Your supplies will come from [facility]. This is who [employer] requires you use  We discussed how untreated sleep apnea puts an individual at risk for cardiac arrhthymias, pulm HTN, DM, stroke and increases their risk for daytime accidents.  Start ambien 5 mg At bedtime as needed for sleep. Take within 30 minutes of intended sleep onset, with plans for 7-8 hours of sleep. If the 5 mg does not help you fall asleep and stay asleep, let me know and we can adjust dosing. Do not drive after taking. Stop and notify if any abnormal sleep habits or mood changes develop   Follow up in 6 weeks with Dr. Wynona Neat or Philis Nettle, or sooner, if needed

## 2022-11-02 ENCOUNTER — Other Ambulatory Visit (HOSPITAL_COMMUNITY): Payer: Self-pay

## 2022-11-15 ENCOUNTER — Other Ambulatory Visit: Payer: Self-pay

## 2022-11-15 ENCOUNTER — Other Ambulatory Visit (HOSPITAL_COMMUNITY): Payer: Self-pay

## 2022-11-19 ENCOUNTER — Other Ambulatory Visit (HOSPITAL_COMMUNITY): Payer: Self-pay

## 2022-11-19 ENCOUNTER — Other Ambulatory Visit: Payer: Self-pay

## 2022-11-23 ENCOUNTER — Other Ambulatory Visit (HOSPITAL_COMMUNITY): Payer: Self-pay

## 2022-11-26 ENCOUNTER — Other Ambulatory Visit (HOSPITAL_COMMUNITY): Payer: Self-pay

## 2022-11-29 NOTE — Telephone Encounter (Signed)
Urgent message sent to [facility] about this issue

## 2022-12-02 ENCOUNTER — Encounter (HOSPITAL_BASED_OUTPATIENT_CLINIC_OR_DEPARTMENT_OTHER): Payer: Self-pay

## 2022-12-02 ENCOUNTER — Ambulatory Visit (INDEPENDENT_AMBULATORY_CARE_PROVIDER_SITE_OTHER): Payer: Medicare HMO

## 2022-12-02 VITALS — BP 117/78 | Ht 74.5 in | Wt 230.0 lb

## 2022-12-02 DIAGNOSIS — Z1159 Encounter for screening for other viral diseases: Secondary | ICD-10-CM

## 2022-12-02 DIAGNOSIS — Z0000 Encounter for general adult medical examination without abnormal findings: Secondary | ICD-10-CM

## 2022-12-02 DIAGNOSIS — H9193 Unspecified hearing loss, bilateral: Secondary | ICD-10-CM | POA: Diagnosis not present

## 2022-12-02 NOTE — Progress Notes (Signed)
Because this visit was a virtual/telehealth visit,  certain criteria was not obtained, such a blood pressure, CBG if patient is a diabetic, and timed get up and go. Any medications not marked as "taking" was not mentioned during the medication reconciliation part of the visit. Any vitals not documented were not able to be obtained due to this being a telehealth visit. Vitals that have been documented are verbally provided by the patient.  Patient was unable to self-report a recent blood pressure reading due to a lack of equipment at home via telehealth.  Subjective:   Lance Davis is a 67 y.o. male who presents for Medicare Annual/Subsequent preventive examination.  Visit Complete: Virtual  I connected with  Lance Davis on 12/02/22 by a audio enabled telemedicine application and verified that I am speaking with the correct person using two identifiers.  Patient Location: Home  Provider Location: Home Office  I discussed the limitations of evaluation and management by telemedicine. The patient expressed understanding and agreed to proceed.  Patient Medicare AWV questionnaire was completed by the patient on na; I have confirmed that all information answered by patient is correct and no changes since this date.  Review of Systems     Cardiac Risk Factors include: advanced age (>58men, >17 women);diabetes mellitus;dyslipidemia;hypertension;male gender     Objective:    Today's Vitals   12/02/22 0859 12/02/22 0900  BP: 117/78   Weight: 230 lb (104.3 kg)   Height: 6' 2.5" (1.892 m)   PainSc:  6    Body mass index is 29.14 kg/m.     12/02/2022    9:02 AM 09/04/2021   11:28 AM  Advanced Directives  Does Patient Have a Medical Advance Directive? No Yes  Does patient want to make changes to medical advance directive?  No - Patient declined  Would patient like information on creating a medical advance directive? No - Patient declined     Current Medications  (verified) Outpatient Encounter Medications as of 12/02/2022  Medication Sig   atorvastatin (LIPITOR) 40 MG tablet Take 1 tablet (40 mg total) by mouth daily.   finasteride (PROSCAR) 5 MG tablet Take 1 tablet (5 mg total) by mouth daily.   finasteride (PROSCAR) 5 MG tablet Take 1 tablet by mouth once daily.   losartan (COZAAR) 100 MG tablet Take 1/2 tablet (50 mg total) by mouth daily.   metFORMIN (GLUCOPHAGE) 1000 MG tablet Take 1 tablet (1,000 mg total) by mouth 2 (two) times daily with a meal.   tamsulosin (FLOMAX) 0.4 MG CAPS capsule Take 1 capsule (0.4 mg total) by mouth daily.   tirzepatide [facility]) 5 MG/0.5ML Pen Inject 5 mg into the skin once a week.   zolpidem (AMBIEN) 5 MG tablet Take 1 tablet (5 mg total) by mouth at bedtime as needed for sleep.   No facility-administered encounter medications on file as of 12/02/2022.    Allergies (verified) Patient has no known allergies.   History: Past Medical History:  Diagnosis Date   Allergy    spring time   Aortic atherosclerosis (HCC) 11/11/2021   Arthritis    Atypical nevus 04/26/2016   mild - right mid back   Blood transfusion 1963   Cancer (HCC) 2019   melanoma face   Clark level II melanoma (HCC) 12/13/2017   left inner cheek   Palpitations 11/11/2021   Sleep apnea    cpap   Squamous cell carcinoma in situ of skin 04/26/2016   inner left cheek  Past Surgical History:  Procedure Laterality Date   COLONOSCOPY     I & D EXTREMITY  07/14/2011   Procedure: IRRIGATION AND DEBRIDEMENT EXTREMITY;  Surgeon: Sharma Covert, MD;  Location: [facility] OR;  Service: Orthopedics;  Laterality: Left;   POLYPECTOMY     TENDON REPAIR  07/14/2011   Procedure: TENDON REPAIR;  Surgeon: Sharma Covert, MD;  Location: [facility] OR;  Service: Orthopedics;  Laterality: Left;   Family History  Problem Relation Age of Onset   Diabetes Mother    Heart disease Mother    CAD Mother    Frontotemporal dementia Father    Valvular heart disease Father     Diabetes Sister    Epilepsy Sister    Diabetes Brother    Valvular heart disease Maternal Uncle    Colon cancer Neg Hx    Colon polyps Neg Hx    Esophageal cancer Neg Hx    Stomach cancer Neg Hx    Rectal cancer Neg Hx    Social History   Socioeconomic History   Marital status: Married    Spouse name: Not on file   Number of children: Not on file   Years of education: Not on file   Highest education level: Not on file  Occupational History   Not on file  Tobacco Use   Smoking status: Never   Smokeless tobacco: Never  Vaping Use   Vaping status: Never Used  Substance and Sexual Activity   Alcohol use: Yes    Alcohol/week: 14.0 standard drinks of alcohol    Types: 14 Glasses of wine per week   Drug use: No   Sexual activity: Yes  Other Topics Concern   Not on file  Social History Narrative   Not on file   Social Determinants of Health   Financial Resource Strain: Low Risk  (12/02/2022)   Overall Financial Resource Strain (CARDIA)    Difficulty of Paying Living Expenses: Not hard at all  Food Insecurity: No Food Insecurity (12/02/2022)   Hunger Vital Sign    Worried About Running Out of Food in the Last Year: Never true    Ran Out of Food in the Last Year: Never true  Transportation Needs: No Transportation Needs (12/02/2022)   PRAPARE - Administrator, Civil Service (Medical): No    Lack of Transportation (Non-Medical): No  Physical Activity: Sufficiently Active (12/02/2022)   Exercise Vital Sign    Days of Exercise per Week: 7 days    Minutes of Exercise per Session: 30 min  Stress: No Stress Concern Present (12/02/2022)   [employer] of Occupational Health - Occupational Stress Questionnaire    Feeling of Stress : Not at all  Social Connections: Socially Integrated (12/02/2022)   Social Connection and Isolation Panel [NHANES]    Frequency of Communication with Friends and Family: More than three times a week    Frequency of Social Gatherings with  Friends and Family: More than three times a week    Attends Religious Services: More than 4 times per year    Active Member of [employer] or Organizations: Yes    Attends Engineer, structural: More than 4 times per year    Marital Status: Married    Tobacco Counseling Counseling given: Yes   Clinical Intake:  Pre-visit preparation completed: Yes  Pain : 0-10 Pain Score: 6  Pain Type: Chronic pain Pain Location: Hip Pain Orientation: Right, Left Pain Descriptors / Indicators: Aching Pain Onset: More  than a month ago Pain Frequency: Constant     BMI - recorded: 29.14 Nutritional Status: BMI 25 -29 Overweight Nutritional Risks: None Diabetes: Yes CBG done?: No (telehealth visit. unable to obtain cbg.) Did pt. bring in CBG monitor from home?: No  How often do you need to have someone help you when you read instructions, pamphlets, or other written materials from your doctor or pharmacy?: 1 - Never  Interpreter Needed?: No  Information entered by :: Abby Delayla Hoffmaster, CMA   Activities of Daily Living    12/02/2022    9:02 AM 06/17/2022    9:35 AM  In your present state of health, do you have any difficulty performing the following activities:  Hearing? 0 0  Vision? 0 0  Difficulty concentrating or making decisions? 0 0  Walking or climbing stairs? 0 0  Dressing or bathing? 0 0  Doing errands, shopping? 0 0  Preparing Food and eating ? N   Using the Toilet? N   In the past six months, have you accidently leaked urine? N   Do you have problems with loss of bowel control? N   Managing your Medications? N   Managing your Finances? N   Housekeeping or managing your Housekeeping? N     Patient Care Team: [place], Buren Kos, MD as PCP - General (Family Medicine) Chilton Si, MD as Attending Physician (Cardiology)  Indicate any recent Medical Services you may have received from other than [facility] providers in the past year (date may be approximate).      Assessment:   This is a routine wellness examination for Lance Davis.  Hearing/Vision screen Hearing Screening - Comments:: Patient complains for hearing difficulties. Referral placed today to be evaluated. Patient states he thinks the right side is worse than the left side.   Vision Screening - Comments:: Wears rx glasses - up to date with routine eye exams    Dietary issues and exercise activities discussed:     Goals Addressed             This Visit's Progress    Patient Stated       Lose a little weight and get into better shape.        Depression Screen    12/02/2022    9:05 AM 09/29/2022    9:08 AM 06/17/2022    9:34 AM 01/25/2022   10:27 AM 10/22/2021    9:42 AM 10/14/2021   11:38 AM 09/04/2021   11:28 AM  PHQ 2/9 Scores  PHQ - 2 Score 0 0 0 0 0 0 0  PHQ- 9 Score  3  3 0 0   Exception Documentation  Medical reason Medical reason  Medical reason Medical reason     Fall Risk    12/02/2022    9:04 AM 09/29/2022    9:08 AM 06/17/2022    9:34 AM 01/25/2022   10:27 AM 10/22/2021    9:41 AM  Fall Risk   Falls in the past year? 0 0 0 0 0  Number falls in past yr: 0 0 0  0  Injury with Fall? 0 0 0  0  Risk for fall due to : No Fall Risks No Fall Risks No Fall Risks  No Fall Risks  Follow up Falls prevention discussed Falls evaluation completed Falls evaluation completed  Falls evaluation completed    MEDICARE RISK AT HOME: Medicare Risk at Home Any stairs in or around the home?: No If so, are there any  without handrails?: No Home free of loose throw rugs in walkways, pet beds, electrical cords, etc?: Yes Adequate lighting in your home to reduce risk of falls?: Yes Life alert?: No Use of a cane, walker or w/c?: No Grab bars in the bathroom?: No Shower chair or bench in shower?: No Elevated toilet seat or a handicapped toilet?: No  TIMED UP AND GO:  Was the test performed?  No    Cognitive Function:        12/02/2022    9:05 AM 09/04/2021   11:30 AM  6CIT  Screen  What Year? 0 points 0 points  What month? 0 points 0 points  What time? 0 points 0 points  Count back from 20 0 points 0 points  Months in reverse 0 points 0 points  Repeat phrase 0 points 2 points  Total Score 0 points 2 points    Immunizations Immunization History  Administered Date(s) Administered   COVID-19, mRNA, vaccine(Comirnaty)12 years and older 04/14/2022   Fluad Quad(high Dose 65+) 01/04/2022   Moderna SARS-COV2 Booster Vaccination 10/08/2020   PFIZER(Purple Top)SARS-COV-2 Vaccination 01/11/2020   Pfizer Covid-19 Vaccine Bivalent Booster 74yrs & up 12/15/2020   Tetanus 04/05/2014   Zoster Recombinant(Shingrix) 03/30/2017, 07/01/2017    TDAP status: Due, Education has been provided regarding the importance of this vaccine. Advised may receive this vaccine at local pharmacy or Health Dept. Aware to provide a copy of the vaccination record if obtained from local pharmacy or Health Dept. Verbalized acceptance and understanding.  Flu Vaccine status: Due, Education has been provided regarding the importance of this vaccine. Advised may receive this vaccine at local pharmacy or Health Dept. Aware to provide a copy of the vaccination record if obtained from local pharmacy or Health Dept. Verbalized acceptance and understanding.  Pneumococcal vaccine status: Due, Education has been provided regarding the importance of this vaccine. Advised may receive this vaccine at local pharmacy or Health Dept. Aware to provide a copy of the vaccination record if obtained from local pharmacy or Health Dept. Verbalized acceptance and understanding.  Covid-19 vaccine status: Information provided on how to obtain vaccines.   Qualifies for Shingles Vaccine? No   Zostavax completed No   Shingrix Completed?: Yes  Screening Tests Health Maintenance  Topic Date Due   Hepatitis C Screening  Never done   DTaP/Tdap/Td (1 - Tdap) 04/06/2014   Pneumonia Vaccine 55+ Years old (1 of 1 - PCV)  Never done   COVID-19 Vaccine (4 - 2023-24 season) 06/09/2022   Medicare Annual Wellness (AWV)  09/05/2022   INFLUENZA VACCINE  11/04/2022   OPHTHALMOLOGY EXAM  11/05/2022   FOOT EXAM  01/26/2023   HEMOGLOBIN A1C  03/31/2023   Diabetic kidney evaluation - eGFR measurement  06/21/2023   Diabetic kidney evaluation - Urine ACR  09/29/2023   Colonoscopy  02/16/2028   Zoster Vaccines- Shingrix  Completed   HPV VACCINES  Aged Out    Health Maintenance  Health Maintenance Due  Topic Date Due   Hepatitis C Screening  Never done   DTaP/Tdap/Td (1 - Tdap) 04/06/2014   Pneumonia Vaccine 74+ Years old (1 of 1 - PCV) Never done   COVID-19 Vaccine (4 - 2023-24 season) 06/09/2022   Medicare Annual Wellness (AWV)  09/05/2022   INFLUENZA VACCINE  11/04/2022   OPHTHALMOLOGY EXAM  11/05/2022    Colorectal cancer screening: Type of screening: Colonoscopy. Completed 02/15/2018. Repeat every 10 years  Lung Cancer Screening: (Low Dose CT Chest recommended if Age 70-80 years,  20 pack-year currently smoking OR have quit w/in 15years.) does not qualify.   Lung Cancer Screening Referral: na  Additional Screening:  Hepatitis C Screening: does qualify; Ordered 12/02/2022  Vision Screening: Recommended annual ophthalmology exams for early detection of glaucoma and other disorders of the eye. Is the patient up to date with their annual eye exam?  Yes  Who is the provider or what is the name of the office in which the patient attends annual eye exams? [facility] If pt is not established with a provider, would they like to be referred to a provider to establish care? No .   Dental Screening: Recommended annual dental exams for proper oral hygiene  Diabetic Foot Exam: Diabetic Foot Exam: Completed 01/25/2022  Community Resource Referral / Chronic Care Management: CRR required this visit?  No   CCM required this visit?  No     Plan:     I have personally reviewed and noted the following in the  patient's chart:   Medical and social history Use of alcohol, tobacco or illicit drugs  Current medications and supplements including opioid prescriptions. Patient is not currently taking opioid prescriptions. Functional ability and status Nutritional status Physical activity Advanced directives List of other physicians Hospitalizations, surgeries, and ER visits in previous 12 months Vitals Screenings to include cognitive, depression, and falls Referrals and appointments  In addition, I have reviewed and discussed with patient certain preventive protocols, quality metrics, and best practice recommendations. A written personalized care plan for preventive services as well as general preventive health recommendations were provided to patient.     Jordan Hawks Tania Perrott, CMA   12/02/2022   After Visit Summary: (MyChart) Due to this being a telephonic visit, the after visit summary with patients personalized plan was offered to patient via MyChart   Nurse Notes:

## 2022-12-02 NOTE — Patient Instructions (Signed)
Lance Davis , Thank you for taking time to come for your Medicare Wellness Visit. I appreciate your ongoing commitment to your health goals. Please review the following plan we discussed and let me know if I can assist you in the future.   Referrals/Orders/Follow-Ups/Clinician Recommendations:  You have been referred to an audiologist to have a hearing test.If you haven't heard from them in the next week, please call their office to schedule your appointment.   [facility] Address: [address], [place], [place] [address] Phone: [phone]  You are due for the vaccines checked below. You may have these done at your preferred pharmacy. Please have them fax the office proof of the vaccines so that we can update your chart.   [x]  Flu (due annually) []  Shingrix (Shingles vaccine) [x]  Pneumonia Vaccines [x]  TDAP (Tetanus) Vaccine every 10 years [x]  Covid-19   This is a list of the screening recommended for you and due dates:  Health Maintenance  Topic Date Due   Hepatitis C Screening  Never done   DTaP/Tdap/Td vaccine (1 - Tdap) 04/06/2014   Pneumonia Vaccine (1 of 1 - PCV) Never done   COVID-19 Vaccine (4 - 2023-24 season) 06/09/2022   Flu Shot  11/04/2022   Eye exam for diabetics  11/05/2022   Complete foot exam   01/26/2023   Hemoglobin A1C  03/31/2023   Yearly kidney function blood test for diabetes  06/21/2023   Yearly kidney health urinalysis for diabetes  09/29/2023   Medicare Annual Wellness Visit  12/02/2023   Colon Cancer Screening  02/16/2028   Zoster (Shingles) Vaccine  Completed   HPV Vaccine  Aged Out    Advanced directives: (Declined) Advance directive discussed with you today. Even though you declined this today, please call our office should you change your mind, and we can give you the proper paperwork for you to fill out.  Next Medicare Annual Wellness Visit scheduled for next year: Yes Preventive Care 62 Years and Older, Male Preventive care refers to  lifestyle choices and visits with your health care provider that can promote health and wellness. Preventive care visits are also called wellness exams. What can I expect for my preventive care visit? Counseling During your preventive care visit, your health care provider may ask about your: Medical history, including: Past medical problems. Family medical history. History of falls. Current health, including: Emotional well-being. Home life and relationship well-being. Sexual activity. Memory and ability to understand (cognition). Lifestyle, including: Alcohol, nicotine or tobacco, and drug use. Access to firearms. Diet, exercise, and sleep habits. Work and work Astronomer. Sunscreen use. Safety issues such as seatbelt and bike helmet use. Physical exam Your health care provider will check your: Height and weight. These may be used to calculate your BMI (body mass index). BMI is a measurement that tells if you are at a healthy weight. Waist circumference. This measures the distance around your waistline. This measurement also tells if you are at a healthy weight and may help predict your risk of certain diseases, such as type 2 diabetes and high blood pressure. Heart rate and blood pressure. Body temperature. Skin for abnormal spots. What immunizations do I need?  Vaccines are usually given at various ages, according to a schedule. Your health care provider will recommend vaccines for you based on your age, medical history, and lifestyle or other factors, such as travel or where you work. What tests do I need? Screening Your health care provider may recommend screening tests  for certain conditions. This may include: Lipid and cholesterol levels. Diabetes screening. This is done by checking your blood sugar (glucose) after you have not eaten for a while (fasting). Hepatitis C test. Hepatitis B test. HIV (human immunodeficiency virus) test. STI (sexually transmitted infection)  testing, if you are at risk. Lung cancer screening. Colorectal cancer screening. Prostate cancer screening. Abdominal aortic aneurysm (AAA) screening. You may need this if you are a current or former smoker. Talk with your health care provider about your test results, treatment options, and if necessary, the need for more tests. Follow these instructions at home: Eating and drinking  Eat a diet that includes fresh fruits and vegetables, whole grains, lean protein, and low-fat dairy products. Limit your intake of foods with high amounts of sugar, saturated fats, and salt. Take vitamin and mineral supplements as recommended by your health care provider. Do not drink alcohol if your health care provider tells you not to drink. If you drink alcohol: Limit how much you have to 0-2 drinks a day. Know how much alcohol is in your drink. In the [place] one drink equals one 12 oz bottle of beer (355 mL), one 5 oz glass of wine (148 mL), or one 1 oz glass of hard liquor (44 mL). Lifestyle Brush your teeth every morning and night with fluoride toothpaste. Floss one time each day. Exercise for at least 30 minutes 5 or more days each week. Do not use any products that contain nicotine or tobacco. These products include cigarettes, chewing tobacco, and vaping devices, such as e-cigarettes. If you need help quitting, ask your health care provider. Do not use drugs. If you are sexually active, practice safe sex. Use a condom or other form of protection to prevent STIs. Take aspirin only as told by your health care provider. Make sure that you understand how much to take and what form to take. Work with your health care provider to find out whether it is safe and beneficial for you to take aspirin daily. Ask your health care provider if you need to take a cholesterol-lowering medicine (statin). Find healthy ways to manage stress, such as: Meditation, yoga, or listening to music. Journaling. Talking to a  trusted person. Spending time with friends and family. Safety Always wear your seat belt while driving or riding in a vehicle. Do not drive: If you have been drinking alcohol. Do not ride with someone who has been drinking. When you are tired or distracted. While texting. If you have been using any mind-altering substances or drugs. Wear a helmet and other protective equipment during sports activities. If you have firearms in your house, make sure you follow all gun safety procedures. Minimize exposure to UV radiation to reduce your risk of skin cancer. What's next? Visit your health care provider once a year for an annual wellness visit. Ask your health care provider how often you should have your eyes and teeth checked. Stay up to date on all vaccines. This information is not intended to replace advice given to you by your health care provider. Make sure you discuss any questions you have with your health care provider. Document Revised: 09/17/2020 Document Reviewed: 09/17/2020 Elsevier Patient Education  2024 [employer]. Understanding Your Risk for Falls Millions of people have serious injuries from falls each year. It is important to understand your risk of falling. Talk with your health care provider about your risk and what you can do to lower it. If you do have a serious fall,  make sure to tell your provider. Falling once raises your risk of falling again. How can falls affect me? Serious injuries from falls are common. These include: Broken bones, such as hip fractures. Head injuries, such as traumatic brain injuries (TBI) or concussions. A fear of falling can cause you to avoid activities and stay at home. This can make your muscles weaker and raise your risk for a fall. What can increase my risk? There are a number of risk factors that increase your risk for falling. The more risk factors you have, the higher your risk of falling. Serious injuries from a fall happen most  often to people who are older than 69 years old. Teenagers and young adults ages 15-29 are also at higher risk. Common risk factors include: Weakness in the lower body. Being generally weak or confused due to long-term (chronic) illness. Dizziness or balance problems. Poor vision. Medicines that cause dizziness or drowsiness. These may include: Medicines for your blood pressure, heart, anxiety, insomnia, or swelling (edema). Pain medicines. Muscle relaxants. Other risk factors include: Drinking alcohol. Having had a fall in the past. Having foot pain or wearing improper footwear. Working at a dangerous job. Having any of the following in your home: Tripping hazards, such as floor clutter or loose rugs. Poor lighting. Pets. Having dementia or memory loss. What actions can I take to lower my risk of falling?     Physical activity Stay physically fit. Do strength and balance exercises. Consider taking a regular class to build strength and balance. Yoga and tai chi are good options. Vision Have your eyes checked every year and your prescription for glasses or contacts updated as needed. Shoes and walking aids Wear non-skid shoes. Wear shoes that have rubber soles and low heels. Do not wear high heels. Do not walk around the house in socks or slippers. Use a cane or walker as told by your provider. Home safety Attach secure railings on both sides of your stairs. Install grab bars for your bathtub, shower, and toilet. Use a non-skid mat in your bathtub or shower. Attach bath mats securely with double-sided, non-slip rug tape. Use good lighting in all rooms. Keep a flashlight near your bed. Make sure there is a clear path from your bed to the bathroom. Use night-lights. Do not use throw rugs. Make sure all carpeting is taped or tacked down securely. Remove all clutter from walkways and stairways, including extension cords. Repair uneven or broken steps and floors. Avoid walking on  icy or slippery surfaces. Walk on the grass instead of on icy or slick sidewalks. Use ice melter to get rid of ice on walkways in the winter. Use a cordless phone. Questions to ask your health care provider Can you help me check my risk for a fall? Do any of my medicines make me more likely to fall? Should I take a vitamin D supplement? What exercises can I do to improve my strength and balance? Should I make an appointment to have my vision checked? Do I need a bone density test to check for weak bones (osteoporosis)? Would it help to use a cane or a walker? Where to find more information Centers for Disease Control and Prevention, STEADI: TonerPromos.no Community-Based Fall Prevention Programs: TonerPromos.no [employer] on Aging: BaseRingTones.pl Contact a health care provider if: You fall at home. You are afraid of falling at home. You feel weak, drowsy, or dizzy. This information is not intended to replace advice given to you by your health  care provider. Make sure you discuss any questions you have with your health care provider. Document Revised: 11/23/2021 Document Reviewed: 11/23/2021 Elsevier Patient Education  2024 [employer].

## 2022-12-03 NOTE — Telephone Encounter (Signed)
I called [facility]. Glenda said Nida Boatman built the order and sent it to the [employer] but he did not add a prescription with it. Rivka Barbara will call Elige Radon to see why not and call PT to advise status. I let the PT know.

## 2022-12-03 NOTE — Telephone Encounter (Signed)
PT calling again. States he called [employer] and they found him in the system. They said they sent his order to [facility]. He called [facility] and they can find no ppwk.   We sent a urgent request to [facility] it looks like. Has that been replied to yet? Please call PT to advise. His needs are time sensitive. Thanks.

## 2022-12-08 NOTE — Telephone Encounter (Signed)
Left message following up on patients concerns.

## 2022-12-10 ENCOUNTER — Telehealth: Payer: Self-pay | Admitting: Nurse Practitioner

## 2022-12-10 NOTE — Telephone Encounter (Signed)
Pt having a hard time getting equipment

## 2022-12-10 NOTE — Telephone Encounter (Signed)
Sent message to [place] and he sent back this: [place], Philmore Pali, Rodman Comp, CMA; Seward Grater,  Per order notes and order from resupply team, Patients supplies were delivered on 12-08-22.  Thank you,  Luellen Pucker       I have LMOM for patient to call me back regarding equipment.

## 2022-12-13 ENCOUNTER — Encounter (HOSPITAL_BASED_OUTPATIENT_CLINIC_OR_DEPARTMENT_OTHER): Payer: Self-pay | Admitting: *Deleted

## 2022-12-13 ENCOUNTER — Other Ambulatory Visit (HOSPITAL_COMMUNITY): Payer: Self-pay

## 2022-12-13 ENCOUNTER — Other Ambulatory Visit (HOSPITAL_BASED_OUTPATIENT_CLINIC_OR_DEPARTMENT_OTHER): Payer: Self-pay | Admitting: Family Medicine

## 2022-12-13 ENCOUNTER — Telehealth (HOSPITAL_BASED_OUTPATIENT_CLINIC_OR_DEPARTMENT_OTHER): Payer: Self-pay | Admitting: *Deleted

## 2022-12-13 ENCOUNTER — Ambulatory Visit: Payer: Medicare HMO | Admitting: Nurse Practitioner

## 2022-12-13 MED ORDER — ATORVASTATIN CALCIUM 40 MG PO TABS
40.0000 mg | ORAL_TABLET | Freq: Every day | ORAL | 0 refills | Status: DC
  Filled 2022-12-13 – 2022-12-23 (×2): qty 90, 90d supply, fill #0

## 2022-12-13 NOTE — Telephone Encounter (Signed)
LMOM for patient to return call if he still needed equipment.

## 2022-12-13 NOTE — Telephone Encounter (Signed)
Called pt to see if diabetic eye exam had been completed. Pt advised this was completed at [facility] in [place]. Advised we would request the records and update the chart.

## 2022-12-15 ENCOUNTER — Other Ambulatory Visit (HOSPITAL_COMMUNITY): Payer: Self-pay

## 2022-12-17 ENCOUNTER — Other Ambulatory Visit (HOSPITAL_BASED_OUTPATIENT_CLINIC_OR_DEPARTMENT_OTHER): Payer: Self-pay | Admitting: Family Medicine

## 2022-12-17 ENCOUNTER — Other Ambulatory Visit (HOSPITAL_COMMUNITY): Payer: Self-pay

## 2022-12-17 DIAGNOSIS — E1169 Type 2 diabetes mellitus with other specified complication: Secondary | ICD-10-CM

## 2022-12-20 ENCOUNTER — Other Ambulatory Visit (HOSPITAL_COMMUNITY): Payer: Self-pay

## 2022-12-20 ENCOUNTER — Encounter: Payer: Self-pay | Admitting: Nurse Practitioner

## 2022-12-20 ENCOUNTER — Other Ambulatory Visit: Payer: Self-pay

## 2022-12-20 MED ORDER — METFORMIN HCL 1000 MG PO TABS
1000.0000 mg | ORAL_TABLET | Freq: Two times a day (BID) | ORAL | 0 refills | Status: DC
  Filled 2022-12-20: qty 180, 90d supply, fill #0

## 2022-12-21 ENCOUNTER — Other Ambulatory Visit (HOSPITAL_BASED_OUTPATIENT_CLINIC_OR_DEPARTMENT_OTHER): Payer: Self-pay

## 2022-12-21 MED ORDER — INFLUENZA VAC A&B SURF ANT ADJ 0.5 ML IM SUSY
0.5000 mL | PREFILLED_SYRINGE | Freq: Once | INTRAMUSCULAR | 0 refills | Status: AC
  Filled 2022-12-21: qty 0.5, 1d supply, fill #0

## 2022-12-21 MED ORDER — COVID-19 MRNA VAC-TRIS(PFIZER) 30 MCG/0.3ML IM SUSY
0.3000 mL | PREFILLED_SYRINGE | Freq: Once | INTRAMUSCULAR | 0 refills | Status: AC
  Filled 2022-12-21: qty 0.3, 1d supply, fill #0

## 2022-12-23 ENCOUNTER — Other Ambulatory Visit (HOSPITAL_COMMUNITY): Payer: Self-pay

## 2022-12-27 DIAGNOSIS — R351 Nocturia: Secondary | ICD-10-CM | POA: Diagnosis not present

## 2022-12-27 DIAGNOSIS — N401 Enlarged prostate with lower urinary tract symptoms: Secondary | ICD-10-CM | POA: Diagnosis not present

## 2022-12-27 DIAGNOSIS — R310 Gross hematuria: Secondary | ICD-10-CM | POA: Diagnosis not present

## 2022-12-30 ENCOUNTER — Ambulatory Visit (HOSPITAL_BASED_OUTPATIENT_CLINIC_OR_DEPARTMENT_OTHER): Payer: Medicare HMO | Admitting: Family Medicine

## 2023-01-18 ENCOUNTER — Other Ambulatory Visit (HOSPITAL_COMMUNITY): Payer: Self-pay

## 2023-01-19 ENCOUNTER — Other Ambulatory Visit (HOSPITAL_COMMUNITY): Payer: Self-pay

## 2023-01-19 ENCOUNTER — Other Ambulatory Visit: Payer: Self-pay

## 2023-01-26 ENCOUNTER — Encounter (HOSPITAL_BASED_OUTPATIENT_CLINIC_OR_DEPARTMENT_OTHER): Payer: Self-pay | Admitting: Family Medicine

## 2023-01-26 ENCOUNTER — Ambulatory Visit (HOSPITAL_BASED_OUTPATIENT_CLINIC_OR_DEPARTMENT_OTHER): Payer: Medicare HMO | Admitting: Family Medicine

## 2023-01-26 VITALS — BP 123/76 | HR 76 | Ht 74.5 in | Wt 240.0 lb

## 2023-01-26 DIAGNOSIS — I10 Essential (primary) hypertension: Secondary | ICD-10-CM

## 2023-01-26 DIAGNOSIS — Z7984 Long term (current) use of oral hypoglycemic drugs: Secondary | ICD-10-CM

## 2023-01-26 DIAGNOSIS — E119 Type 2 diabetes mellitus without complications: Secondary | ICD-10-CM

## 2023-01-26 DIAGNOSIS — M25512 Pain in left shoulder: Secondary | ICD-10-CM | POA: Diagnosis not present

## 2023-01-26 LAB — POCT GLYCOSYLATED HEMOGLOBIN (HGB A1C)
HbA1c, POC (controlled diabetic range): 5.6 % (ref 0.0–7.0)
Hemoglobin A1C: 5.6 % (ref 4.0–5.6)

## 2023-01-26 NOTE — Assessment & Plan Note (Signed)
Blood pressure at goal in office today.  He denies any current issues related to lightheadedness or dizziness.  He does note that blood pressure will be better controlled when he is more regularly active.  We have previously had intermittent adjustments to blood pressure medication based on activity level and blood pressure readings. We will continue to monitor blood pressure closely moving forward and determine if any further changes are needed to blood pressure medications/dosage. Recommend intermittent monitoring of blood pressure at home, DASH diet

## 2023-01-26 NOTE — Assessment & Plan Note (Signed)
Patient reports he is doing well.  Continues with Mounjaro and metformin.  Some weeks, will notice side effects from [facility].  After discussion, patient does think that this may ultimately be related to dietary choices.  He does think that weeks where he has had more symptoms he has had more meals with higher fat content.  Otherwise, patient reports that he is doing well. We will proceed with check of hemoglobin A1c today.  A1c has been well-controlled recently. No changes to medications today.  Could consider adjusting Mounjaro if continue to have any side effects or concerns. Plan to complete foot exam at future visit

## 2023-01-26 NOTE — Assessment & Plan Note (Signed)
Intermittently, patient will have some left shoulder pain.  He does have history of injury to left shoulder remotely.  No issues with numbness or tingling.  Has had occasional popping, this is not painful when it occurs.  At prior evaluation, did discuss considerations related to home exercise regimen, physical therapy.  At this time, symptoms are not overly concerning for patient.  Handout provided today with home exercises for patient to utilize.  If any changes do occur, recommend return to the office, can consider referral to physical therapy.

## 2023-01-26 NOTE — Progress Notes (Signed)
    Procedures performed today:    None.  Independent interpretation of notes and tests performed by another provider:   None.  Brief History, Exam, Impression, and Recommendations:    BP 123/76 (BP Location: Right Arm, Patient Position: Sitting, Cuff Size: Normal)   Pulse 76   Ht 6' 2.5" (1.892 m)   Wt 240 lb (108.9 kg)   SpO2 98%   BMI 30.40 kg/m   Type 2 diabetes mellitus without complication, without long-term current use of insulin (HCC) Assessment & Plan: Patient reports he is doing well.  Continues with Mounjaro and metformin.  Some weeks, will notice side effects from [facility].  After discussion, patient does think that this may ultimately be related to dietary choices.  He does think that weeks where he has had more symptoms he has had more meals with higher fat content.  Otherwise, patient reports that he is doing well. We will proceed with check of hemoglobin A1c today.  A1c has been well-controlled recently. No changes to medications today.  Could consider adjusting Mounjaro if continue to have any side effects or concerns. Plan to complete foot exam at future visit  Orders: -     POCT glycosylated hemoglobin (Hb A1C)  Primary hypertension Assessment & Plan: Blood pressure at goal in office today.  He denies any current issues related to lightheadedness or dizziness.  He does note that blood pressure will be better controlled when he is more regularly active.  We have previously had intermittent adjustments to blood pressure medication based on activity level and blood pressure readings. We will continue to monitor blood pressure closely moving forward and determine if any further changes are needed to blood pressure medications/dosage. Recommend intermittent monitoring of blood pressure at home, DASH diet   Left shoulder pain, unspecified chronicity Assessment & Plan: Intermittently, patient will have some left shoulder pain.  He does have history of injury to left  shoulder remotely.  No issues with numbness or tingling.  Has had occasional popping, this is not painful when it occurs.  At prior evaluation, did discuss considerations related to home exercise regimen, physical therapy.  At this time, symptoms are not overly concerning for patient.  Handout provided today with home exercises for patient to utilize.  If any changes do occur, recommend return to the office, can consider referral to physical therapy.   Return in about 4 months (around 05/29/2023) for diabetes, hypertension.   ___________________________________________ Jenafer [place], MD, ABFM, CAQSM Primary Care and Sports Medicine [facility]

## 2023-02-03 ENCOUNTER — Ambulatory Visit: Payer: Medicare HMO | Admitting: Nurse Practitioner

## 2023-02-03 ENCOUNTER — Encounter: Payer: Self-pay | Admitting: Nurse Practitioner

## 2023-02-03 VITALS — BP 120/80 | HR 65 | Ht 74.5 in | Wt 242.8 lb

## 2023-02-03 DIAGNOSIS — F5101 Primary insomnia: Secondary | ICD-10-CM

## 2023-02-03 DIAGNOSIS — G4739 Other sleep apnea: Secondary | ICD-10-CM

## 2023-02-03 NOTE — Patient Instructions (Addendum)
Continue to use CPAP every night, minimum of 4-6 hours a night.  Change equipment every 30 days or as directed by DME. Wash your tubing with warm soap and water daily, hang to dry. Wash humidifier portion weekly. Use bottled, distilled water and change daily  Be aware of reduced alertness and do not drive or operate heavy machinery if experiencing this or drowsiness.  Notify if persistent daytime sleepiness occurs even with consistent use of CPAP.   Continue ambien 5 mg At bedtime as needed for sleep. Take within 30 minutes of intended sleep onset, with plans for 7-8 hours of sleep. If the 5 mg does not help you fall asleep within 30 min-1 hr, you can take an additional 5 mg. Do not drive after taking. Stop and notify if any abnormal sleep habits or mood changes develop    Follow up in 6 months with Dr. Wynona Neat or Philis Nettle, or sooner, if needed

## 2023-02-03 NOTE — Assessment & Plan Note (Signed)
Improved with ambien. Current dosing not always effective with improving sleep latency. Advised he could take an additional 5 mg, for a total of 10 mg, 30 min-1 hr after initial dose if not effective. Side effect profile reviewed. Understands to not drive after taking. Sleep hygiene reviewed.

## 2023-02-03 NOTE — Assessment & Plan Note (Signed)
Mixed complex sleep apnea. Excellent compliance and control on current CPAP settings. Residual AHI 1.1/h. Moderate leaks that are not bothersome. Aware of proper care/use. Safe driving practices reviewed. Understands to ensure he applies CPAP after taking sedative.   Patient Instructions  Continue to use CPAP every night, minimum of 4-6 hours a night.  Change equipment every 30 days or as directed by DME. Wash your tubing with warm soap and water daily, hang to dry. Wash humidifier portion weekly. Use bottled, distilled water and change daily  Be aware of reduced alertness and do not drive or operate heavy machinery if experiencing this or drowsiness.  Notify if persistent daytime sleepiness occurs even with consistent use of CPAP.   Continue ambien 5 mg At bedtime as needed for sleep. Take within 30 minutes of intended sleep onset, with plans for 7-8 hours of sleep. If the 5 mg does not help you fall asleep within 30 min-1 hr, you can take an additional 5 mg. Do not drive after taking. Stop and notify if any abnormal sleep habits or mood changes develop    Follow up in 6 months with Dr. Wynona Neat or Philis Nettle, or sooner, if needed

## 2023-02-03 NOTE — Progress Notes (Signed)
@Patient  ID: Lance Davis, male    DOB: January 01, 1956, 67 y.o.   MRN: [id number]  Chief Complaint  Patient presents with   Follow-up    Pt is here for OSA/CPAP F/U visit.    Referring provider: de Peru, Buren Kos, MD  HPI: 67 year old male, never smoker followed for sleep apnea on OSA and insomnia. Past medical history significant for severe complex sleep apnea on CPAP, HTN, thoracic aneurysm, BPH, HLD.   TEST/EVENTS:  11/16/2011 PSG: AHI 35.6/h. Mixed central and obstructive. Sleep markedly fragmented throughout the night except interval between 1:30-2:30 am.   11/01/2022: Ov with Lyda Colcord NP for sleep consult, referred by Dr. De Peru. He has a long history of sleep apnea. Last sleep study 2013 with severe sleep apnea, mixed central and obstructive events.  He wears his CPAP nightly.  Does not feel like he could sleep without it.  Receives benefit from using it.  Wakes feeling rested most mornings.  He does still have some difficulties with sleep onset.  Sometimes it can take him an hour or longer to fall asleep but other nights he does not have as much trouble.  He has tried melatonin which has been relatively unsuccessful for him.  Denies any morning headaches, drowsy driving, sleep parasomnia/paralysis. Goes to bed between 10 PM and midnight.  Sleep latency varies.  Wakes 1-4 times a night.  Usually gets up around 6 to 8 AM.  His weight is down 50 pounds over the last 2 years.  Last sleep study was in 2013.  He wears a nasal mask.  Curious if there are any other alternative nasal mask options that he could wear that would not go over the bridge of his nose.  Feels like his pressures are set appropriately.  No significant leaks.  Does not wear any supplemental oxygen.  Download shows excellent compliance with 100% greater than 4 hours of use and with residual AHI 0.4. He has a history of high blood pressure and diabetes.  No history of stroke.  He is a never smoker.  Drinks 1-3 alcoholic beverages  5 nights a week.  No current sleep aids.  No excessive caffeine intake.  Lives with his spouse.  He is retired.  Does operate a motor vehicle but no other heavy machinery.  Family history of heart disease. [place] 4  02/03/2023: Today - follow up Patient presents today for follow up. He was started on ambien at our last visit for chronic insomniac symptoms. This has been working well for him for the most part. He doesn't have to use it every night. It usually helps him fall and stay asleep when he does take it. He does have an occasional night where the 5 mg doesn't seem to do anything for him and he lies awake for a while. He denies any sleep parasomnias/paralysis. Energy levels better during the day. Wearing his CPAP nightly. He does feel like he sleeps and breathes better with it. No morning headaches or drowsy driving. No significant leaks.   01/02/2023-01/31/2023 CPAP 5-11 cmH2O 30/30 days; 97% >4 hr; average use 6 hr 43 min Pressure 95th 8.6 Leaks 95th 32.8 AHI 1.1  No Known Allergies  Immunization History  Administered Date(s) Administered   Fluad Quad(high Dose 65+) 01/04/2022   Fluad Trivalent(High Dose 65+) 12/21/2022   Moderna SARS-COV2 Booster Vaccination 10/08/2020   PFIZER(Purple Top)SARS-COV-2 Vaccination 01/11/2020   Pfizer Covid-19 Vaccine Bivalent Booster 55yrs & up 12/15/2020   Pfizer(Comirnaty)Fall Seasonal Vaccine 12  years and older 04/14/2022, 12/21/2022   Tetanus 04/05/2014   Zoster Recombinant(Shingrix) 03/30/2017, 06/01/2017, 07/01/2017    Past Medical History:  Diagnosis Date   Allergy    spring time   Aortic atherosclerosis (HCC) 11/11/2021   Arthritis    Atypical nevus 04/26/2016   mild - right mid back   Blood transfusion 1963   Cancer (HCC) 2019   melanoma face   Clark level II melanoma (HCC) 12/13/2017   left inner cheek   Palpitations 11/11/2021   Sleep apnea    cpap   Squamous cell carcinoma in situ of skin 04/26/2016   inner left cheek     Tobacco History: Social History   Tobacco Use  Smoking Status Never  Smokeless Tobacco Never   Counseling given: Not Answered   Outpatient Medications Prior to Visit  Medication Sig Dispense Refill   atorvastatin (LIPITOR) 40 MG tablet Take 1 tablet (40 mg total) by mouth daily. 90 tablet 0   finasteride (PROSCAR) 5 MG tablet Take 1 tablet (5 mg total) by mouth daily. 90 tablet 3   losartan (COZAAR) 100 MG tablet Take 1/2 tablet (50 mg total) by mouth daily. 90 tablet 1   metFORMIN (GLUCOPHAGE) 1000 MG tablet Take 1 tablet (1,000 mg total) by mouth 2 (two) times daily with a meal. 180 tablet 0   tamsulosin (FLOMAX) 0.4 MG CAPS capsule Take 1 capsule (0.4 mg total) by mouth daily. 90 capsule 3   tirzepatide (MOUNJARO) 5 MG/0.5ML Pen Inject 5 mg into the skin once a week. 6 mL 1   zolpidem (AMBIEN) 5 MG tablet Take 1 tablet (5 mg total) by mouth at bedtime as needed for sleep. 30 tablet 5   finasteride (PROSCAR) 5 MG tablet Take 1 tablet by mouth once daily. 90 tablet 3   No facility-administered medications prior to visit.     Review of Systems:   Constitutional: No night sweats, fevers, chills, or lassitude. +occasional fatigue, intentional weight loss HEENT: No headaches, difficulty swallowing, tooth/dental problems, or sore throat. No sneezing, itching, ear ache +occasional nasal congestion (baseline) CV:  No chest pain, orthopnea, PND, swelling in lower extremities, anasarca, dizziness, palpitations, syncope Resp: No shortness of breath with exertion or at rest. No excess mucus or change in color of mucus. No productive or non-productive. No hemoptysis. No wheezing.  No chest wall deformity GI:  No heartburn, indigestion GU: No nocturia Skin: No rash, lesions, ulcerations MSK:  No joint pain or swelling.   Neuro: No dizziness or lightheadedness.  Psych: No depression or anxiety. Mood stable. +sleep disturbance (improved)    Physical Exam:  BP 120/80 (BP Location:  Right Arm, Cuff Size: Normal)   Pulse 65   Ht 6' 2.5" (1.892 m)   Wt 242 lb 12.8 oz (110.1 kg)   SpO2 95%   BMI 30.76 kg/m   GEN: Pleasant, interactive, well-appearing; obese; in no acute distress. HEENT:  Normocephalic and atraumatic. PERRLA. Sclera white. Nasal turbinates pink, moist and patent bilaterally. No rhinorrhea present. Oropharynx pink and moist, without exudate or edema. No lesions, ulcerations, or postnasal drip. Mallampati III NECK:  Supple w/ fair ROM. No JVD present. Normal carotid impulses w/o bruits. Thyroid symmetrical with no goiter or nodules palpated. No lymphadenopathy.   CV: RRR, no m/r/g, no peripheral edema. Pulses intact, +2 bilaterally. No cyanosis, pallor or clubbing. PULMONARY:  Unlabored, regular breathing. Clear bilaterally A&P w/o wheezes/rales/rhonchi. No accessory muscle use.  GI: BS present and normoactive. Soft, non-tender to palpation. No  organomegaly or masses detected.  MSK: No erythema, warmth or tenderness. Cap refil <2 sec all extrem. No deformities or joint swelling noted.  Neuro: A/Ox3. No focal deficits noted.   Skin: Warm, no lesions or rashe Psych: Normal affect and behavior. Judgement and thought content appropriate.     Lab Results:  CBC    Component Value Date/Time   WBC 5.7 06/21/2022 0931   RBC 4.32 06/21/2022 0931   HGB 13.4 06/21/2022 0931   HCT 39.9 06/21/2022 0931   PLT 219 06/21/2022 0931   MCV 92 06/21/2022 0931   MCH 31.0 06/21/2022 0931   MCHC 33.6 06/21/2022 0931   RDW 12.4 06/21/2022 0931   LYMPHSABS 1.4 06/21/2022 0931   EOSABS 0.1 06/21/2022 0931   BASOSABS 0.1 06/21/2022 0931    BMET    Component Value Date/Time   NA 139 06/21/2022 0931   K 5.2 06/21/2022 0931   CL 101 06/21/2022 0931   CO2 25 06/21/2022 0931   GLUCOSE 97 06/21/2022 0931   BUN 13 06/21/2022 0931   CREATININE 0.89 06/21/2022 0931   CALCIUM 9.8 06/21/2022 0931    BNP No results found for: "BNP"   Imaging:  No results  found.  Administration History     None           No data to display          No results found for: "NITRICOXIDE"      Assessment & Plan:   Mixed sleep apnea Mixed complex sleep apnea. Excellent compliance and control on current CPAP settings. Residual AHI 1.1/h. Moderate leaks that are not bothersome. Aware of proper care/use. Safe driving practices reviewed. Understands to ensure he applies CPAP after taking sedative.   Patient Instructions  Continue to use CPAP every night, minimum of 4-6 hours a night.  Change equipment every 30 days or as directed by DME. Wash your tubing with warm soap and water daily, hang to dry. Wash humidifier portion weekly. Use bottled, distilled water and change daily  Be aware of reduced alertness and do not drive or operate heavy machinery if experiencing this or drowsiness.  Notify if persistent daytime sleepiness occurs even with consistent use of CPAP.   Continue ambien 5 mg At bedtime as needed for sleep. Take within 30 minutes of intended sleep onset, with plans for 7-8 hours of sleep. If the 5 mg does not help you fall asleep within 30 min-1 hr, you can take an additional 5 mg. Do not drive after taking. Stop and notify if any abnormal sleep habits or mood changes develop    Follow up in 6 months with Dr. Wynona Neat or Philis Nettle, or sooner, if needed    Insomnia Improved with ambien. Current dosing not always effective with improving sleep latency. Advised he could take an additional 5 mg, for a total of 10 mg, 30 min-1 hr after initial dose if not effective. Side effect profile reviewed. Understands to not drive after taking. Sleep hygiene reviewed.     I spent 32 minutes of dedicated to the care of this patient on the date of this encounter to include pre-visit review of records, face-to-face time with the patient discussing conditions above, post visit ordering of testing, clinical documentation with the electronic health record,  making appropriate referrals as documented, and communicating necessary findings to members of the patients care team.  Noemi Chapel, NP 02/03/2023  Pt aware and understands NP's role.

## 2023-02-14 ENCOUNTER — Encounter (HOSPITAL_BASED_OUTPATIENT_CLINIC_OR_DEPARTMENT_OTHER): Payer: Self-pay | Admitting: Family

## 2023-02-14 ENCOUNTER — Other Ambulatory Visit (HOSPITAL_BASED_OUTPATIENT_CLINIC_OR_DEPARTMENT_OTHER): Payer: Self-pay

## 2023-02-14 ENCOUNTER — Ambulatory Visit (HOSPITAL_BASED_OUTPATIENT_CLINIC_OR_DEPARTMENT_OTHER): Payer: Medicare HMO | Admitting: Family

## 2023-02-14 VITALS — BP 126/70 | HR 70 | Ht 74.5 in | Wt 241.7 lb

## 2023-02-14 DIAGNOSIS — I7121 Aneurysm of the ascending aorta, without rupture: Secondary | ICD-10-CM | POA: Diagnosis not present

## 2023-02-14 DIAGNOSIS — R002 Palpitations: Secondary | ICD-10-CM

## 2023-02-14 DIAGNOSIS — E785 Hyperlipidemia, unspecified: Secondary | ICD-10-CM

## 2023-02-14 DIAGNOSIS — L821 Other seborrheic keratosis: Secondary | ICD-10-CM | POA: Diagnosis not present

## 2023-02-14 DIAGNOSIS — I10 Essential (primary) hypertension: Secondary | ICD-10-CM | POA: Diagnosis not present

## 2023-02-14 DIAGNOSIS — L570 Actinic keratosis: Secondary | ICD-10-CM | POA: Diagnosis not present

## 2023-02-14 DIAGNOSIS — I700 Atherosclerosis of aorta: Secondary | ICD-10-CM

## 2023-02-14 DIAGNOSIS — D229 Melanocytic nevi, unspecified: Secondary | ICD-10-CM | POA: Diagnosis not present

## 2023-02-14 DIAGNOSIS — Z85820 Personal history of malignant melanoma of skin: Secondary | ICD-10-CM | POA: Diagnosis not present

## 2023-02-14 DIAGNOSIS — L814 Other melanin hyperpigmentation: Secondary | ICD-10-CM | POA: Diagnosis not present

## 2023-02-14 DIAGNOSIS — L820 Inflamed seborrheic keratosis: Secondary | ICD-10-CM | POA: Diagnosis not present

## 2023-02-14 DIAGNOSIS — L578 Other skin changes due to chronic exposure to nonionizing radiation: Secondary | ICD-10-CM | POA: Diagnosis not present

## 2023-02-14 DIAGNOSIS — Z85828 Personal history of other malignant neoplasm of skin: Secondary | ICD-10-CM | POA: Diagnosis not present

## 2023-02-14 MED ORDER — LOSARTAN POTASSIUM 50 MG PO TABS
50.0000 mg | ORAL_TABLET | Freq: Every day | ORAL | 3 refills | Status: DC
  Filled 2023-02-14 – 2023-03-14 (×5): qty 90, 90d supply, fill #0
  Filled 2023-06-06 – 2023-07-08 (×3): qty 90, 90d supply, fill #1
  Filled 2023-09-29: qty 90, 90d supply, fill #2
  Filled 2023-10-29 – 2023-12-28 (×2): qty 90, 90d supply, fill #3

## 2023-02-14 NOTE — Progress Notes (Signed)
Cardiology Office Note:  .   Date:  02/14/2023  ID:  Lance Davis, DOB [birth year], MRN [id number] PCP: de Peru, [name], MD  [facility] Providers Cardiologist:  Chilton Si, MD    History of Present Illness: [name] Kitchen   Lance Davis is a 67 y.o. male with history of hypertension, diabetes, OSA, ascending aortic aneurysm.  Prior calcium score 2011 of 145 placing him in the 62nd percentile with mild ascending aortic aneurysm 4.1 cm.  Chest CT 02/2021 was unchanged.  Seen 11/11/2021 for episode of chest pain and dizziness while playing golf.  Episode was more consistent with palpitations in the setting intravascular volume depletion.  Echo 12/01/2021 LVEF 60 to 65%, gr1dd, ascending aorta 44 mm, aortic root 43 mm.  Repeat CT 06/25/2022 ascending aorta 42 mm, aortic root 42 mm.  Presents today for follow up. Recently enjoyed a trip to [place]. Considering trip to [place] and [place]. Will get lightheaded after a few hours of strenuous labor which he attributes to hypotension. This is overall intermittent. Discussed hydrating prior to strenuous activity. Reports no shortness of breath nor dyspnea on exertion. Reports no chest pain, pressure, or tightness. No edema, orthopnea, PND. Reports no palpitations.    ROS: Please see the history of present illness.    All other systems reviewed and are negative.   Studies Reviewed: [name] Kitchen   EKG Interpretation Date/Time:  Monday February 14 2023 10:11:07 EST Ventricular Rate:  63 PR Interval:  208 QRS Duration:  86 QT Interval:  384 QTC Calculation: 392 R Axis:   -29  Text Interpretation: Normal sinus rhythm Normal ECG Confirmed by Lance Davis ([id number]) on 02/14/2023 10:14:26 AM    Cardiac Studies & Procedures       ECHOCARDIOGRAM  ECHOCARDIOGRAM COMPLETE 06/25/2022  Narrative ECHOCARDIOGRAM REPORT    Patient Name:   Lance Davis Date of Exam: 06/25/2022 Medical Rec #:  [id number]        Height:       74.5 in Accession #:     [id number]       Weight:       241.2 lb Date of Birth:  June 16, 1955        BSA:          2.364 m Patient Age:    67 years         BP:           123/80 mmHg Patient Gender: M                HR:           72 bpm. Exam Location:  Outpatient  Procedure: 2D Echo, 3D Echo, Cardiac Doppler and Color Doppler  Indications:    I77.819 Thoracic aorta ectasia  History:        Patient has prior history of Echocardiogram examinations, most recent 12/23/2021. Signs/Symptoms:Edema; Risk Factors:Hypertension, Diabetes, Dyslipidemia and Non-Smoker. Patient denies chest pain and SOB. He does get mild leg edema.  Sonographer:    Carlos American RVT, RDCS (AE), RDMS Referring Phys: [phone] [facility]   Sonographer Comments: Suboptimal parasternal window and suboptimal apical window. IMPRESSIONS   1. Left ventricular ejection fraction, by estimation, is 60 to 65%. Left ventricular ejection fraction by 3D volume is 69 %. The left ventricle has normal function. The left ventricle has no regional wall motion abnormalities. There is mild concentric left ventricular hypertrophy. Left ventricular diastolic parameters are consistent with Grade I diastolic dysfunction (impaired relaxation). 2. Right  ventricular systolic function is normal. The right ventricular size is normal. Tricuspid regurgitation signal is inadequate for assessing PA pressure. 3. Left atrial size was moderately dilated. 4. The mitral valve is normal in structure. No evidence of mitral valve regurgitation. No evidence of mitral stenosis. 5. The aortic valve is tricuspid. Aortic valve regurgitation is trivial. No aortic stenosis is present. 6. There is mild dilatation of the aortic root, measuring 42 mm. There is mild dilatation of the ascending aorta, measuring 42 mm. 7. The inferior vena cava is dilated in size with <50% respiratory variability, suggesting right atrial pressure of 15 mmHg.  Comparison(s): No significant change from prior  study. Prior images reviewed side by side.  FINDINGS Left Ventricle: Left ventricular ejection fraction, by estimation, is 60 to 65%. Left ventricular ejection fraction by 3D volume is 69 %. The left ventricle has normal function. The left ventricle has no regional wall motion abnormalities. 3D ejection fraction reviewed and evaluated as part of the interpretation. Alternate measurement of EF is felt to be most reflective of LV function. The left ventricular internal cavity size was normal in size. There is mild concentric left ventricular hypertrophy. Left ventricular diastolic parameters are consistent with Grade I diastolic dysfunction (impaired relaxation). Indeterminate filling pressures.  Right Ventricle: The right ventricular size is normal. No increase in right ventricular wall thickness. Right ventricular systolic function is normal. Tricuspid regurgitation signal is inadequate for assessing PA pressure.  Left Atrium: Left atrial size was moderately dilated.  Right Atrium: Right atrial size was normal in size.  Pericardium: There is no evidence of pericardial effusion.  Mitral Valve: The mitral valve is normal in structure. No evidence of mitral valve regurgitation. No evidence of mitral valve stenosis.  Tricuspid Valve: The tricuspid valve is normal in structure. Tricuspid valve regurgitation is not demonstrated. No evidence of tricuspid stenosis.  Aortic Valve: The aortic valve is tricuspid. Aortic valve regurgitation is trivial. No aortic stenosis is present. Aortic valve mean gradient measures 3.0 mmHg. Aortic valve peak gradient measures 5.6 mmHg. Aortic valve area, by VTI measures 3.07 cm.  Pulmonic Valve: The pulmonic valve was normal in structure. Pulmonic valve regurgitation is not visualized. No evidence of pulmonic stenosis.  Aorta: The aortic root is normal in size and structure. There is mild dilatation of the aortic root, measuring 42 mm. There is mild dilatation of the  ascending aorta, measuring 42 mm.  Venous: The inferior vena cava is dilated in size with less than 50% respiratory variability, suggesting right atrial pressure of 15 mmHg.  IAS/Shunts: No atrial level shunt detected by color flow Doppler.   LEFT VENTRICLE PLAX 2D LVIDd:         5.26 cm         Diastology LVIDs:         3.37 cm         LV e' medial:    5.12 cm/s LV PW:         1.34 cm         LV E/e' medial:  12.1 LV IVS:        1.30 cm         LV e' lateral:   5.50 cm/s LVOT diam:     2.20 cm         LV E/e' lateral: 11.3 LV SV:         73 LV SV Index:   31 LVOT Area:     3.80 cm  3D Volume EF LV 3D EF:    Left ventricul ar ejection fraction by 3D volume is 69 %.  3D Volume EF: 3D EF:        69 % LV EDV:       154 ml LV ESV:       47 ml LV SV:        107 ml  RIGHT VENTRICLE RV S prime:     13.40 cm/s TAPSE (M-mode): 1.9 cm  LEFT ATRIUM             Index        RIGHT ATRIUM           Index LA diam:        4.80 cm 2.03 cm/m   RA Area:     20.40 cm LA Vol (A2C):   71.1 ml 30.07 ml/m  RA Volume:   63.80 ml  26.98 ml/m LA Vol (A4C):   67.9 ml 28.72 ml/m LA Biplane Vol: 75.6 ml 31.97 ml/m AORTIC VALVE                    PULMONIC VALVE AV Area (Vmax):    2.90 cm     PV Vmax:       0.90 m/s AV Area (Vmean):   3.21 cm     PV Peak grad:  3.2 mmHg AV Area (VTI):     3.07 cm AV Vmax:           118.00 cm/s AV Vmean:          75.600 cm/s AV VTI:            0.238 m AV Peak Grad:      5.6 mmHg AV Mean Grad:      3.0 mmHg LVOT Vmax:         90.10 cm/s LVOT Vmean:        63.900 cm/s LVOT VTI:          0.192 m LVOT/AV VTI ratio: 0.81 AR Vena Contracta: 0.34 cm  AORTA Ao Root diam: 4.20 cm Ao Asc diam:  4.20 cm Ao Arch diam: 3.2 cm  MITRAL VALVE MV Area (PHT): 3.33 cm    SHUNTS MV Decel Time: 228 msec    Systemic VTI:  0.19 m MV E velocity: 62.10 cm/s  Systemic Diam: 2.20 cm MV A velocity: 68.30 cm/s MV E/A ratio:  0.91  Mihai Croitoru  MD Electronically signed by Thurmon Fair MD Signature Date/Time: 06/25/2022/3:52:10 PM    Final     CT SCANS  CT CARDIAC SCORING (SELF PAY ONLY) 02/21/2020  Narrative CLINICAL DATA:  67 year old white male with elevated cholesterol.  EXAM: CT CARDIAC CORONARY ARTERY CALCIUM SCORE  TECHNIQUE: Non-contrast imaging through the heart was performed using prospective ECG gating. Image post processing was performed on an independent workstation, allowing for quantitative analysis of the heart and coronary arteries. Note that this exam targets the heart and the chest was not imaged in its entirety.  COMPARISON:  CT abdomen 09/26/2018  FINDINGS: CORONARY CALCIUM SCORES:  Left Main: 21.5  LAD: 64.5  LCx: 0  RCA: 59.3  Total Agatston Score: 145  MESA database percentile: 62  AORTA MEASUREMENTS:  Ascending Aorta: 41 mm  Descending Aorta: 33 mm  OTHER FINDINGS:  Small amount of calcium associated with the descending thoracic aorta. Heart size is normal. Minimal pericardial fluid. Visualized mediastinal and hilar structures are unremarkable. Partially visualized right renal cyst. Scattered patchy and  ground-glass densities in the right lower lung. Similar distribution of lung disease from the examination in 2020 but difficult to exclude slightly increased densities. A tiny punctate pleural-based nodule in the lingula on sequence 9, image 35 is stable. No large pleural effusions. Degenerative changes in the thoracic spine with bridging osteophytes.  IMPRESSION: 1. Coronary calcium score is 145 and this is at percentile 62 for patients of the same age, gender and ethnicity. 2. Fusiform aneurysm of the ascending thoracic aorta measuring up to 4.1 cm. Recommend annual imaging followup by CTA or MRA. This recommendation follows 2010 ACCF/AHA/AATS/ACR/ASA/SCA/SCAI/SIR/STS/SVM Guidelines for the Diagnosis and Management of Patients with Thoracic Aortic  Disease. Circulation. 2010; 121: N562-Z308. Aortic aneurysm NOS (ICD10-I71.9) 3.  Aortic Atherosclerosis (ICD10-I70.0). 4. Patchy densities in the right lower lung. Similar findings on the abdominal CT from 2020 but difficult to exclude slightly increased densities in this area. Findings likely related to post inflammatory changes and scarring. Recommend attention to this area during follow-up of the thoracic aortic aneurysm.   Electronically Signed By: Richarda Overlie M.D. On: 02/21/2020 09:22          Risk Assessment/Calculations:             Physical Exam:   VS:  BP 126/70   Pulse 70   Ht 6' 2.5" (1.892 m)   Wt 241 lb 11.2 oz (109.6 kg)   SpO2 92%   BMI 30.62 kg/m    Wt Readings from Last 3 Encounters:  02/14/23 241 lb 11.2 oz (109.6 kg)  02/03/23 242 lb 12.8 oz (110.1 kg)  01/26/23 240 lb (108.9 kg)    GEN: Well nourished, well developed in no acute distress NECK: No JVD; No carotid bruits CARDIAC: RRR, no murmurs, rubs, gallops RESPIRATORY:  Clear to auscultation without rales, wheezing or rhonchi  ABDOMEN: Soft, non-tender, non-distended EXTREMITIES:  No edema; No deformity   ASSESSMENT AND PLAN: .    Thoracic ascending arctic aneurysm- Echo 06/2022 normal LVEF, gr1dd, aortic rot 42mm and ascending aorta 42mm which was stable. Repeat echo in 1 year already ordered. Continue optimal BP control.  Aortic atherosclerosis/HLD, LDL goal less than 70- 06/2022 total cholesterol 126, triglycerides 79, HDL 65, LDL 45. Stable with no anginal symptoms. No indication for ischemic evaluation.  Continue Atorvastatin.   Palpitations - No recent palpitations, no indication for further evaluation.   HTN - BP well controlled. Continue current antihypertensive regimen.  Will send in 50mg  Losartan tablet so he does not have to continue splitting 100mg  tablet in half.   OSA - CPAP compliance encouraged.   DM2 - 01/26/23 A1c 5.6. Continue to follow with PCP.        Dispo: follow up in 1  year  Signed, Alver Sorrow, NP

## 2023-02-14 NOTE — Patient Instructions (Signed)
Medication Instructions:  Your physician has recommended you make the following change in your medication:   Losartan 50mg  daily  *If you need a refill on your cardiac medications before your next appointment, please call your pharmacy*   Follow-Up: At [facility], you and your health needs are our priority.  As part of our continuing mission to provide you with exceptional heart care, we have created designated Provider Care Teams.  These Care Teams include your primary Cardiologist (physician) and Advanced Practice Providers (APPs -  Physician Assistants and Nurse Practitioners) who all work together to provide you with the care you need, when you need it.  We recommend signing up for the patient portal called "MyChart".  Sign up information is provided on this After Visit Summary.  MyChart is used to connect with patients for Virtual Visits (Telemedicine).  Patients are able to view lab/test results, encounter notes, upcoming appointments, etc.  Non-urgent messages can be sent to your provider as well.   To learn more about what you can do with MyChart, go to [email or web address].    Your next appointment:   12 month(s)  Provider:   Gillian Shields, NP    Other Instructions Could try Docusate Sodium (Colace) over the counter as a stool softener.   Your EKG today looked great!  Information About Your Aneurysm  One of your tests has shown an aneurysm of your ascending aorta. The word "aneurysm" refers to a bulge in an artery (blood vessel). Most people think of them in the context of an emergency, but yours was found incidentally. At this point there is nothing you need to do from a procedure standpoint, but there are some important things to keep in mind for day-to-day life.  Mainstays of therapy for aneurysms include very good blood pressure control, healthy lifestyle, and avoiding tobacco products and street drugs. Research has raised concern that antibiotics in the  fluoroquinolone class could be associated with increased risk of having an aneurysm develop or tear. This includes medicines that end in "floxacin," like Cipro or Levaquin. Make sure to discuss this information with other healthcare providers if you require antibiotics.  Since aneurysms can run in families, you should discuss your diagnosis with first degree relatives as they may need to be screened for this. Regular mild-moderate physical exercise is important, but avoid heavy lifting/weight lifting over 30lbs, chopping wood, shoveling snow or digging heavy earth with a shovel. It is best to avoid activities that cause grunting or straining (medically referred to as a "Valsalva maneuver"). This happens when a person bears down against a closed throat to increase the strength of arm or abdominal muscles. There's often a tendency to do this when lifting heavy weights, doing sit-ups, push-ups or chin-ups, etc., but it may be harmful.  This is a finding I would expect to be monitored periodically by your cardiology team. Most unruptured thoracic aortic aneurysms cause no symptoms, so they are often found during exams for other conditions. Contact a health care provider if you develop any discomfort in your upper back, neck, abdomen, trouble swallowing, cough or hoarseness, or unexplained weight loss. Get help right away if you develop severe pain in your upper back or abdomen that may move into your chest and arms, or any other concerning symptoms such as shortness of breath or fever.

## 2023-02-15 ENCOUNTER — Other Ambulatory Visit (HOSPITAL_BASED_OUTPATIENT_CLINIC_OR_DEPARTMENT_OTHER): Payer: Self-pay | Admitting: Family Medicine

## 2023-02-15 ENCOUNTER — Encounter (HOSPITAL_BASED_OUTPATIENT_CLINIC_OR_DEPARTMENT_OTHER): Payer: Self-pay

## 2023-02-15 DIAGNOSIS — E119 Type 2 diabetes mellitus without complications: Secondary | ICD-10-CM

## 2023-02-16 ENCOUNTER — Other Ambulatory Visit (HOSPITAL_COMMUNITY): Payer: Self-pay

## 2023-02-16 MED ORDER — MOUNJARO 5 MG/0.5ML ~~LOC~~ SOAJ
5.0000 mg | SUBCUTANEOUS | 1 refills | Status: DC
Start: 2023-02-16 — End: 2023-10-13
  Filled 2023-02-16: qty 6, 84d supply, fill #0
  Filled 2023-03-01 – 2023-03-11 (×2): qty 2, 28d supply, fill #0
  Filled 2023-03-28 – 2023-05-02 (×2): qty 2, 28d supply, fill #1
  Filled 2023-06-29: qty 2, 28d supply, fill #2
  Filled 2023-07-21: qty 2, 28d supply, fill #3
  Filled 2023-08-18: qty 2, 28d supply, fill #4
  Filled 2023-09-15 – 2023-09-19 (×2): qty 2, 28d supply, fill #5

## 2023-02-24 ENCOUNTER — Other Ambulatory Visit (HOSPITAL_BASED_OUTPATIENT_CLINIC_OR_DEPARTMENT_OTHER): Payer: Self-pay

## 2023-02-25 ENCOUNTER — Other Ambulatory Visit (HOSPITAL_COMMUNITY): Payer: Self-pay

## 2023-02-28 ENCOUNTER — Other Ambulatory Visit (HOSPITAL_COMMUNITY): Payer: Self-pay

## 2023-03-01 ENCOUNTER — Other Ambulatory Visit (HOSPITAL_COMMUNITY): Payer: Self-pay

## 2023-03-09 ENCOUNTER — Other Ambulatory Visit (HOSPITAL_COMMUNITY): Payer: Self-pay

## 2023-03-10 ENCOUNTER — Other Ambulatory Visit (HOSPITAL_COMMUNITY): Payer: Self-pay

## 2023-03-11 ENCOUNTER — Other Ambulatory Visit: Payer: Self-pay

## 2023-03-11 ENCOUNTER — Other Ambulatory Visit (HOSPITAL_COMMUNITY): Payer: Self-pay

## 2023-03-11 ENCOUNTER — Other Ambulatory Visit (HOSPITAL_BASED_OUTPATIENT_CLINIC_OR_DEPARTMENT_OTHER): Payer: Self-pay

## 2023-03-11 ENCOUNTER — Other Ambulatory Visit (HOSPITAL_BASED_OUTPATIENT_CLINIC_OR_DEPARTMENT_OTHER): Payer: Self-pay | Admitting: Family Medicine

## 2023-03-11 DIAGNOSIS — E1169 Type 2 diabetes mellitus with other specified complication: Secondary | ICD-10-CM

## 2023-03-11 MED ORDER — METFORMIN HCL 1000 MG PO TABS
1000.0000 mg | ORAL_TABLET | Freq: Two times a day (BID) | ORAL | 0 refills | Status: DC
  Filled 2023-03-11: qty 180, 90d supply, fill #0

## 2023-03-11 MED ORDER — ATORVASTATIN CALCIUM 40 MG PO TABS
40.0000 mg | ORAL_TABLET | Freq: Every day | ORAL | 0 refills | Status: DC
  Filled 2023-03-11: qty 90, 90d supply, fill #0

## 2023-03-14 ENCOUNTER — Other Ambulatory Visit (HOSPITAL_COMMUNITY): Payer: Self-pay

## 2023-03-14 ENCOUNTER — Other Ambulatory Visit: Payer: Self-pay

## 2023-03-28 ENCOUNTER — Other Ambulatory Visit (HOSPITAL_COMMUNITY): Payer: Self-pay

## 2023-04-11 DIAGNOSIS — L905 Scar conditions and fibrosis of skin: Secondary | ICD-10-CM | POA: Diagnosis not present

## 2023-04-18 DIAGNOSIS — E871 Hypo-osmolality and hyponatremia: Secondary | ICD-10-CM | POA: Diagnosis not present

## 2023-04-18 DIAGNOSIS — R0789 Other chest pain: Secondary | ICD-10-CM | POA: Diagnosis not present

## 2023-04-19 ENCOUNTER — Encounter (HOSPITAL_BASED_OUTPATIENT_CLINIC_OR_DEPARTMENT_OTHER): Payer: Self-pay | Admitting: Family Medicine

## 2023-04-19 ENCOUNTER — Telehealth (HOSPITAL_BASED_OUTPATIENT_CLINIC_OR_DEPARTMENT_OTHER): Payer: Self-pay | Admitting: *Deleted

## 2023-04-19 DIAGNOSIS — E871 Hypo-osmolality and hyponatremia: Secondary | ICD-10-CM

## 2023-04-19 DIAGNOSIS — F5104 Psychophysiologic insomnia: Secondary | ICD-10-CM

## 2023-04-19 NOTE — Telephone Encounter (Signed)
 Pt advised with verbal understanding

## 2023-04-19 NOTE — Telephone Encounter (Signed)
 Spoke with patient. He went to ER in [place]  1/13 with low sodium. Patient wanted to schedule an appt let him know first available was 1/24. He wants to know if labs can be ordered to have redrawn tomorrow morning. His wife will send over a copy of labs from er in [place]  to advise he is scheduled for 1/24 to follow up but would like to go ahead and have labs rechecked

## 2023-04-19 NOTE — Telephone Encounter (Signed)
 Copied from CRM [id number]. Topic: Clinical - Medical Advice >> Apr 19, 2023  8:54 AM Nada Libman H wrote: Reason for CRM: Patient was in the Emergency Department on 1/13 and per Epic send request to [facility]

## 2023-04-19 NOTE — Addendum Note (Signed)
 Addended by: DE Peru, Marcy Salvo J on: 04/19/2023 01:27 PM   Modules accepted: Orders

## 2023-04-20 ENCOUNTER — Other Ambulatory Visit (HOSPITAL_BASED_OUTPATIENT_CLINIC_OR_DEPARTMENT_OTHER): Payer: Self-pay | Admitting: Family Medicine

## 2023-04-20 DIAGNOSIS — E871 Hypo-osmolality and hyponatremia: Secondary | ICD-10-CM | POA: Diagnosis not present

## 2023-04-21 ENCOUNTER — Other Ambulatory Visit (HOSPITAL_BASED_OUTPATIENT_CLINIC_OR_DEPARTMENT_OTHER): Payer: Self-pay

## 2023-04-21 LAB — BASIC METABOLIC PANEL
BUN/Creatinine Ratio: 16 (ref 10–24)
BUN: 15 mg/dL (ref 8–27)
CO2: 23 mmol/L (ref 20–29)
Calcium: 9.5 mg/dL (ref 8.6–10.2)
Chloride: 99 mmol/L (ref 96–106)
Creatinine, Ser: 0.93 mg/dL (ref 0.76–1.27)
Glucose: 99 mg/dL (ref 70–99)
Potassium: 5.1 mmol/L (ref 3.5–5.2)
Sodium: 137 mmol/L (ref 134–144)
eGFR: 89 mL/min/{1.73_m2} (ref >59)

## 2023-04-21 MED ORDER — ZOLPIDEM TARTRATE 5 MG PO TABS
10.0000 mg | ORAL_TABLET | Freq: Every evening | ORAL | 5 refills | Status: DC | PRN
  Filled 2023-04-21 – 2023-07-12 (×2): qty 60, 30d supply, fill #0
  Filled 2023-09-20: qty 60, 30d supply, fill #1
  Filled 2023-10-14: qty 60, 30d supply, fill #2

## 2023-04-21 NOTE — Telephone Encounter (Signed)
Rx sent and updated to 10 mg at bedtime. Advise him to not increase beyond this. Use caution when getting up at night- can cause drowsiness and increase risk for falls. Do not drive after taking until next day. Thanks.

## 2023-04-29 ENCOUNTER — Ambulatory Visit (HOSPITAL_BASED_OUTPATIENT_CLINIC_OR_DEPARTMENT_OTHER): Payer: Medicare HMO | Admitting: Family Medicine

## 2023-04-29 ENCOUNTER — Other Ambulatory Visit (HOSPITAL_BASED_OUTPATIENT_CLINIC_OR_DEPARTMENT_OTHER): Payer: Self-pay

## 2023-05-02 ENCOUNTER — Other Ambulatory Visit (HOSPITAL_BASED_OUTPATIENT_CLINIC_OR_DEPARTMENT_OTHER): Payer: Self-pay

## 2023-05-04 ENCOUNTER — Other Ambulatory Visit (HOSPITAL_BASED_OUTPATIENT_CLINIC_OR_DEPARTMENT_OTHER): Payer: Self-pay

## 2023-05-09 ENCOUNTER — Other Ambulatory Visit (HOSPITAL_BASED_OUTPATIENT_CLINIC_OR_DEPARTMENT_OTHER): Payer: Self-pay

## 2023-05-09 MED ORDER — TAMSULOSIN HCL 0.4 MG PO CAPS
0.4000 mg | ORAL_CAPSULE | Freq: Two times a day (BID) | ORAL | 1 refills | Status: DC
  Filled 2023-05-09 – 2023-08-02 (×2): qty 180, 90d supply, fill #0

## 2023-05-23 ENCOUNTER — Encounter (HOSPITAL_BASED_OUTPATIENT_CLINIC_OR_DEPARTMENT_OTHER): Payer: Self-pay | Admitting: Family Medicine

## 2023-05-30 ENCOUNTER — Ambulatory Visit (HOSPITAL_BASED_OUTPATIENT_CLINIC_OR_DEPARTMENT_OTHER): Payer: HMO | Admitting: Family Medicine

## 2023-05-30 ENCOUNTER — Encounter (HOSPITAL_BASED_OUTPATIENT_CLINIC_OR_DEPARTMENT_OTHER): Payer: Self-pay | Admitting: Family Medicine

## 2023-05-30 VITALS — BP 111/71 | HR 57 | Ht 74.5 in | Wt 244.6 lb

## 2023-05-30 DIAGNOSIS — M25512 Pain in left shoulder: Secondary | ICD-10-CM

## 2023-05-30 DIAGNOSIS — E871 Hypo-osmolality and hyponatremia: Secondary | ICD-10-CM | POA: Diagnosis not present

## 2023-05-30 DIAGNOSIS — I10 Essential (primary) hypertension: Secondary | ICD-10-CM

## 2023-05-30 DIAGNOSIS — E119 Type 2 diabetes mellitus without complications: Secondary | ICD-10-CM | POA: Diagnosis not present

## 2023-05-30 DIAGNOSIS — Z7984 Long term (current) use of oral hypoglycemic drugs: Secondary | ICD-10-CM

## 2023-05-30 LAB — POCT GLYCOSYLATED HEMOGLOBIN (HGB A1C)
HbA1c POC (<> result, manual entry): 5.7 % (ref 4.0–5.6)
HbA1c, POC (controlled diabetic range): 5.7 % (ref 0.0–7.0)
HbA1c, POC (prediabetic range): 5.7 % (ref 5.7–6.4)
Hemoglobin A1C: 5.7 % — AB (ref 4.0–5.6)

## 2023-05-30 NOTE — Assessment & Plan Note (Signed)
 Generally doing well, continues with Mounjaro and metformin.  Some weeks, will notice side effects from [facility] with waves of nausea, does feel that it has taken away some of the joy with eating.  Does admit that around the holidays he did stop Mounjaro for a month or 2, did resume recently.  Denies any significant issues with resuming medication. We will proceed with check of hemoglobin A1c today.  A1c has previously been very well-controlled. No medication changes today.  Can consider changing Mounjaro to alternative such as Ozempic or Trulicity or switching to a different oral medication. Plan to complete foot exam at future visit

## 2023-05-30 NOTE — Patient Instructions (Signed)
  Medication Instructions:  Your physician recommends that you continue on your current medications as directed. Please refer to the Current Medication list given to you today. --If you need a refill on any your medications before your next appointment, please call your pharmacy first. If no refills are authorized on file call the office.--   Follow-Up: Your next appointment:   Your physician recommends that you schedule a follow-up appointment in: 3-4 months follow up  with Dr. de Peru  You will receive a text message or e-mail with a link to a survey about your care and experience with [place] today! We would greatly appreciate your feedback!   Thanks for letting us be apart of your health journey!!  Primary Care and Sports Medicine   Dr. Ceasar Mons Peru   We encourage you to activate your patient portal called "MyChart".  Sign up information is provided on this After Visit Summary.  MyChart is used to connect with patients for Virtual Visits (Telemedicine).  Patients are able to view lab/test results, encounter notes, upcoming appointments, etc.  Non-urgent messages can be sent to your provider as well. To learn more about what you can do with MyChart, please visit --  [email or web address].

## 2023-05-30 NOTE — Assessment & Plan Note (Signed)
 Intermittently, patient will have some shoulder pain.  He does have reported history of prior dislocation.  No issues with numbness or tingling.  Has had occasional popping, this is not painful when it occurs.  At prior evaluation, did discuss considerations related to home exercise regimen, physical therapy.  At this time, symptoms are not overly concerning for patient.  Handout provided today with home exercises for patient to utilize.  If any changes do occur, recommend return to the office, can consider referral to physical therapy.

## 2023-05-30 NOTE — Assessment & Plan Note (Signed)
 Blood pressure initially elevated in office today.  He denies any current issues related to lightheadedness or dizziness.  He does note that blood pressure will be better controlled when he is more regularly active.  We have previously had intermittent adjustments to blood pressure medication based on activity level and blood pressure readings. Blood pressure did improve on recheck. We will continue to monitor blood pressure closely moving forward and determine if any further changes are needed to blood pressure medications/dosage. Recommend intermittent monitoring of blood pressure at home, DASH diet

## 2023-05-30 NOTE — Assessment & Plan Note (Signed)
 Had recent issue of hyponatremia when on vacation in [place] recently.  Was having some generalized symptoms and thought he may have been having a heart attack and ultimately was found to have low sodium, chloride, calcium.  Primary concern on labs was degree of hyponatremia.  Firm recommendations at the emergency department where he was evaluated, it does seem that it was primarily related to water toxicity and to allow for generous salt intake while decreasing free water intake. Patient did have recheck of labs on returning to the area here and electrolytes and kidney function were normal at that time. Discussed prior labs with patient today.  Discussed that he likely had electrolyte loss and replacement with free water which led to issue when on vacation.

## 2023-05-30 NOTE — Progress Notes (Signed)
    Procedures performed today:    None.  Independent interpretation of notes and tests performed by another provider:   None.  Brief History, Exam, Impression, and Recommendations:    BP (!) 149/75 (BP Location: Right Arm, Patient Position: Sitting, Cuff Size: Normal)   Pulse (!) 57   Ht 6' 2.5" (1.892 m)   Wt 244 lb 9.6 oz (110.9 kg)   SpO2 93%   BMI 30.98 kg/m   Hyponatremia Assessment & Plan: Had recent issue of hyponatremia when on vacation in [place] recently.  Was having some generalized symptoms and thought he may have been having a heart attack and ultimately was found to have low sodium, chloride, calcium.  Primary concern on labs was degree of hyponatremia.  Firm recommendations at the emergency department where he was evaluated, it does seem that it was primarily related to water toxicity and to allow for generous salt intake while decreasing free water intake. Patient did have recheck of labs on returning to the area here and electrolytes and kidney function were normal at that time. Discussed prior labs with patient today.  Discussed that he likely had electrolyte loss and replacement with free water which led to issue when on vacation.   Type 2 diabetes mellitus without complication, without long-term current use of insulin (HCC) Assessment & Plan: Generally doing well, continues with Mounjaro and metformin.  Some weeks, will notice side effects from [facility] with waves of nausea, does feel that it has taken away some of the joy with eating.  Does admit that around the holidays he did stop Mounjaro for a month or 2, did resume recently.  Denies any significant issues with resuming medication. We will proceed with check of hemoglobin A1c today.  A1c has previously been very well-controlled. No medication changes today.  Can consider changing Mounjaro to alternative such as Ozempic or Trulicity or switching to a different oral medication. Plan to complete foot exam at  future visit  Orders: -     POCT glycosylated hemoglobin (Hb A1C)  Left shoulder pain, unspecified chronicity Assessment & Plan: Intermittently, patient will have some shoulder pain.  He does have reported history of prior dislocation.  No issues with numbness or tingling.  Has had occasional popping, this is not painful when it occurs.  At prior evaluation, did discuss considerations related to home exercise regimen, physical therapy.  At this time, symptoms are not overly concerning for patient.  Handout provided today with home exercises for patient to utilize.  If any changes do occur, recommend return to the office, can consider referral to physical therapy.   Primary hypertension Assessment & Plan: Blood pressure initially elevated in office today.  He denies any current issues related to lightheadedness or dizziness.  He does note that blood pressure will be better controlled when he is more regularly active.  We have previously had intermittent adjustments to blood pressure medication based on activity level and blood pressure readings. Blood pressure did improve on recheck. We will continue to monitor blood pressure closely moving forward and determine if any further changes are needed to blood pressure medications/dosage. Recommend intermittent monitoring of blood pressure at home, DASH diet   Return in about 3 months (around 08/27/2023) for diabetes, hypertension.   ___________________________________________ Lance Davis de [place], MD, ABFM, CAQSM Primary Care and Sports Medicine [facility]

## 2023-06-06 ENCOUNTER — Other Ambulatory Visit (HOSPITAL_COMMUNITY): Payer: Self-pay

## 2023-06-08 IMAGING — CT CT ANGIO CHEST
2 of 6 series · 13 of 36 positions shown · IV contrast (iopamidol)
Comparison: 02/21/2020

CLINICAL DATA: Thoracic aortic aneurysm, follow-up

EXAM:
CT ANGIOGRAPHY CHEST WITH CONTRAST
TECHNIQUE: Multidetector CT imaging of the chest was performed using the
standard protocol during bolus administration of intravenous
contrast. Multiplanar CT image reconstructions and MIPs were
obtained to evaluate the vascular anatomy.
CONTRAST:  75mL TRXXC0-0BJ IOPAMIDOL (TRXXC0-0BJ) INJECTION 76%

[Series 5: cta thorax 2.00 bv36 s3 axial arterial · axial · arterial · 0.73mm/px · z∈[+1422,+1716]mm · 12 of 175 slices shown]
[im 14/175  lung]
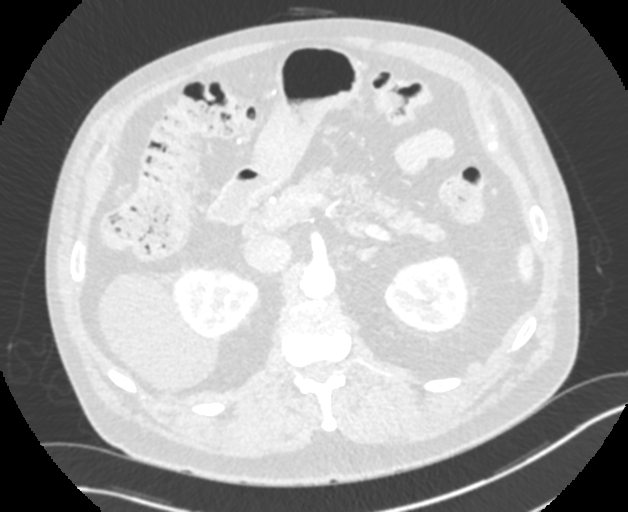
[im 27/175  mediastinal]
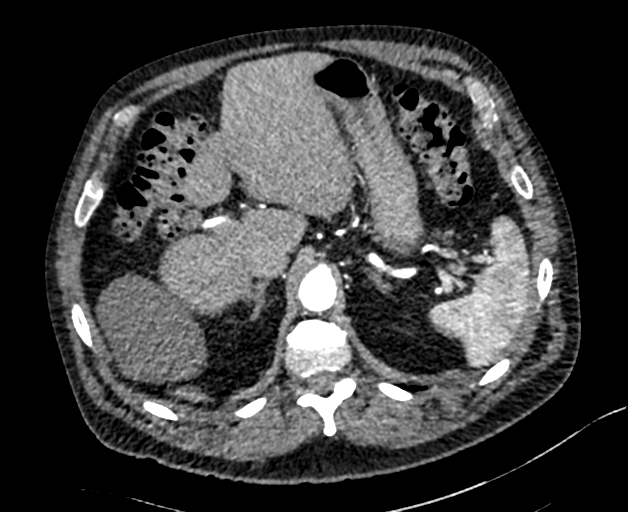
[im 41/175  lung]
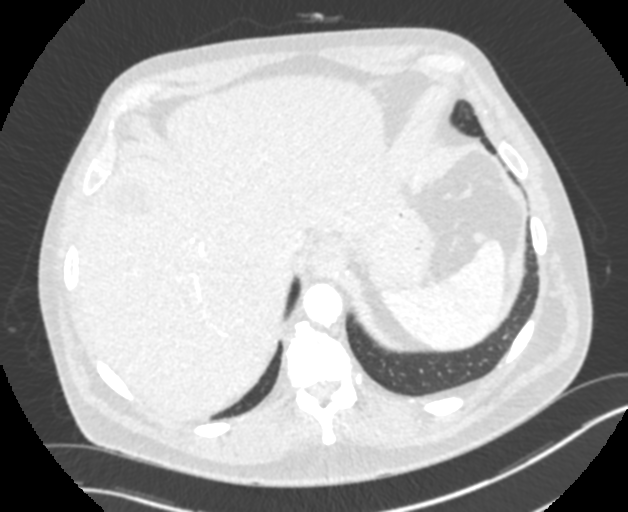
[im 54/175  mediastinal]
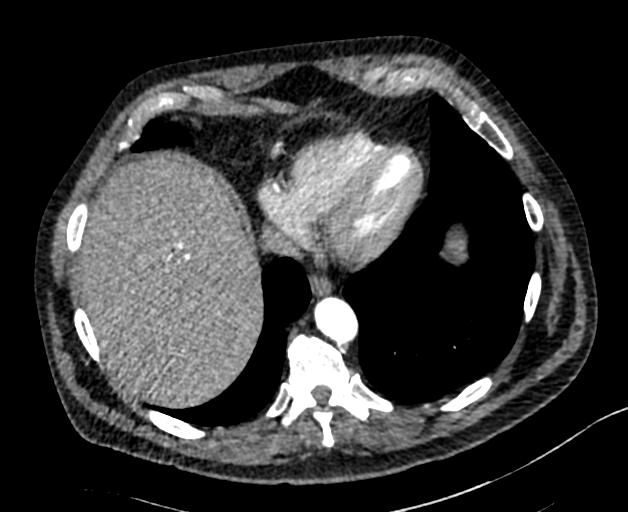
[im 67/175  lung]
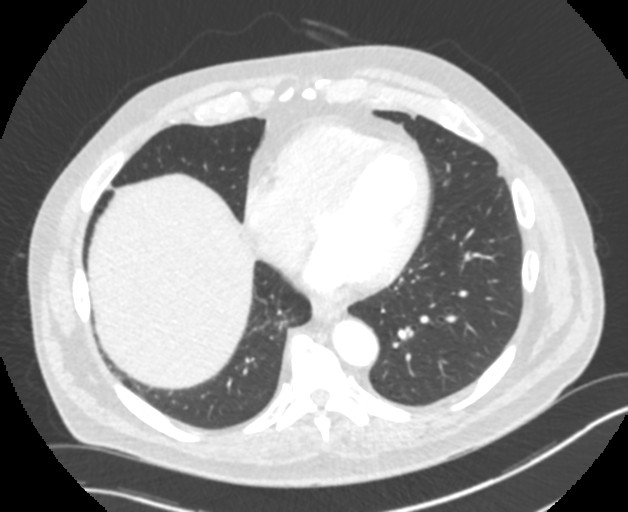
[im 81/175  mediastinal]
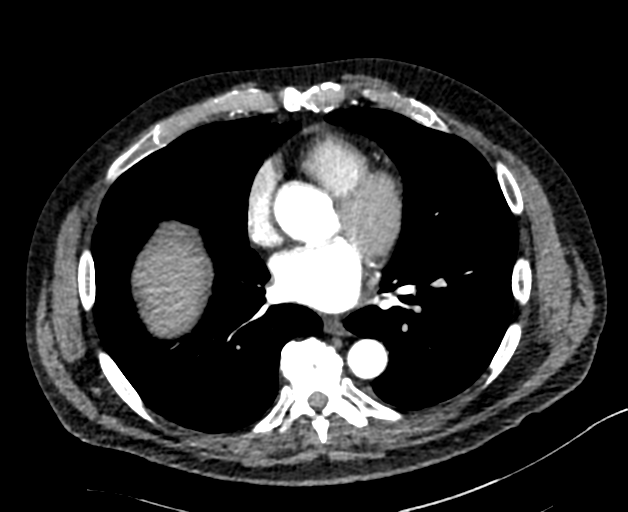
[im 94/175  lung]
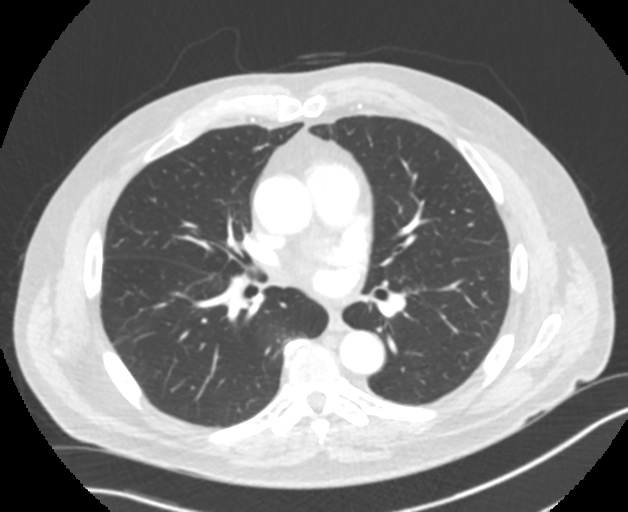
[im 108/175  mediastinal]
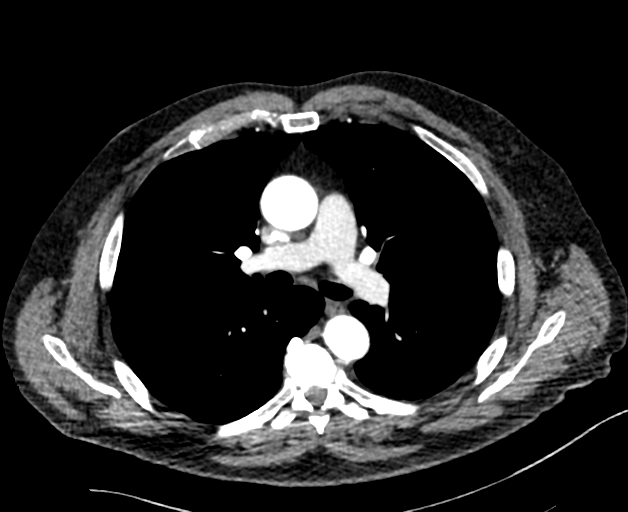
[im 121/175  lung]
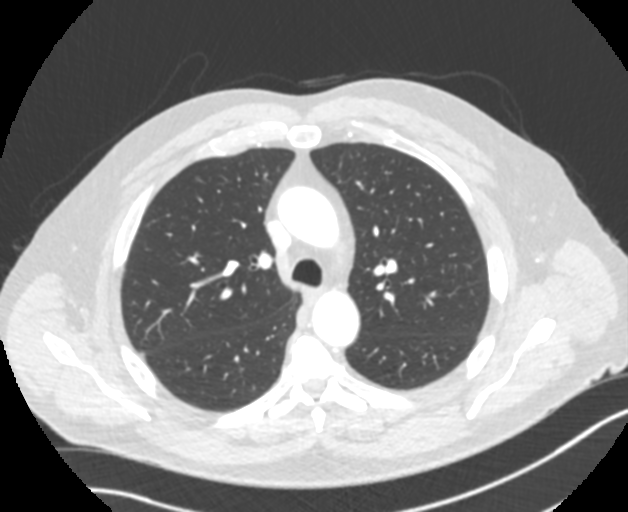
[im 134/175  mediastinal]
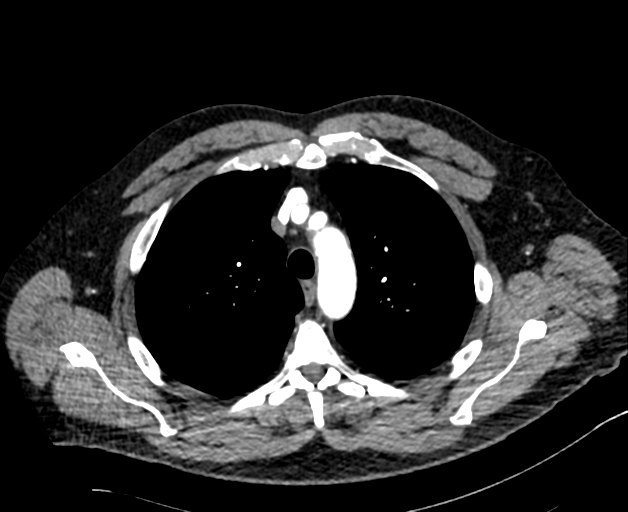
[im 148/175  lung]
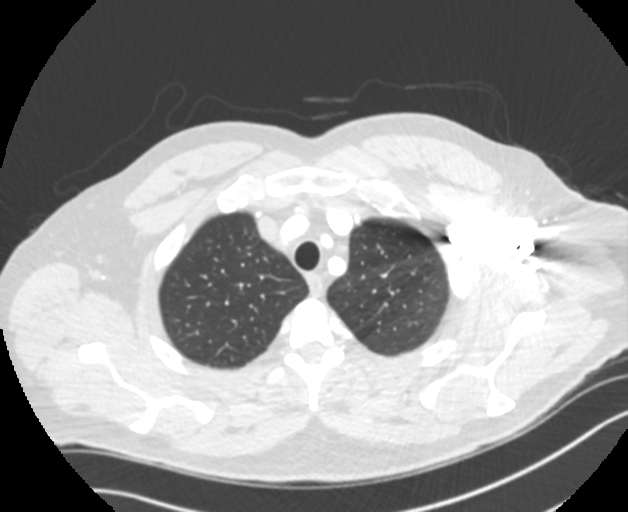
[im 161/175  mediastinal]
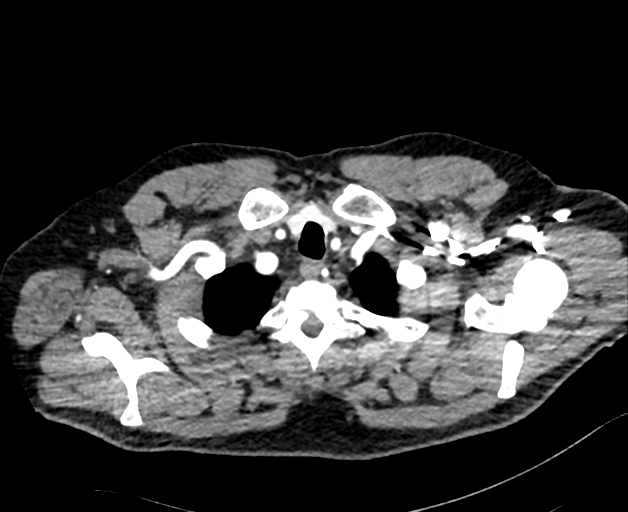

[Series 10: cta thorax 2.00 bv36 s3 cor st · coronal · 0.69mm/px · 1 of 185 slices shown]
[im 93/185  mediastinal]
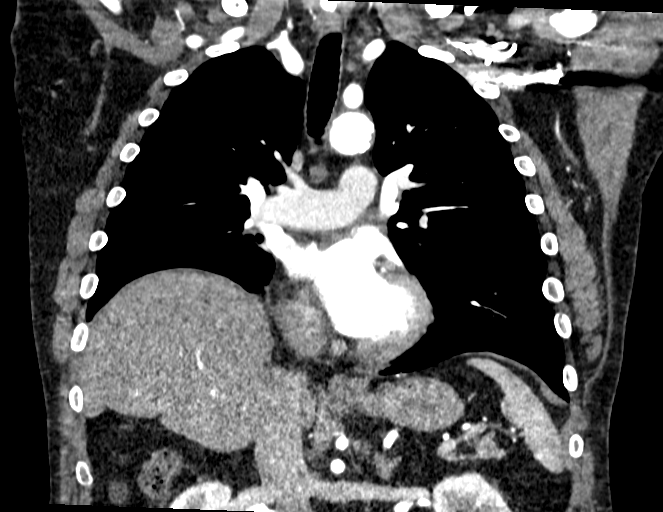

[13 of 36 positions shown; findings below may reference images not displayed]

FINDINGS: Cardiovascular: Heart size normal. No pericardial effusion. The RV
is nondilated. Fair opacification of pulmonary arteries noted, and
there is no evidence of pulmonary emboli. Good contrast
opacification of the thoracic aorta. No dissection or stenosis.
Classic 3 vessel brachiocephalic arterial origin anatomy without
proximal stenosis. Scattered calcified atheromatous plaque in the
distal arch and descending thoracic segment as well as in the
visualized nondilated proximal abdominal aorta.

Aortic Root:

--Valve: 2.7 cm

--Sinuses: 3.7 cm

--Sinotubular Junction: 3.1 cm

Limitations by motion: Moderate

Thoracic Aorta:

--Ascending Aorta: 4 cm

--Aortic Arch: 3.4 cm

--Descending Aorta: 3.3 cm

Mediastinum/Nodes: No mediastinal hematoma, mass or adenopathy.

Lungs/Pleura: No pleural effusion. No pneumothorax. Linear scarring
or atelectasis in the superior segment right lower lobe. The
ground-glass opacities previously seen more inferiorly have largely
resolved, likely of inflammatory/infectious etiology. Lungs
otherwise clear.

Upper Abdomen: 9.6 cm cystic Process posterior to the right kidney
possibly large exophytic cyst. 1.9 cm probable cyst from the upper
pole left kidney. No acute findings.

Musculoskeletal: Anterior vertebral endplate spurring at multiple
levels in the mid and lower thoracic spine. Bilateral shoulder DJD.
No fracture or worrisome bone lesion.

Review of the MIP images confirms the above findings.
IMPRESSION: 1. 4.1 cm ascending thoracic aortic aneurysm (previously 4.1)
without complicating features. Recommend annual imaging followup by
CTA or MRA. This recommendation follows 8494
ACCF/AHA/AATS/ACR/ASA/SCA/KINNARI/SALLY/NOMASIBULELE/FITTEN Guidelines for the
Diagnosis and Management of Patients with Thoracic Aortic Disease.
Circulation. 8494; 121: [id number]
2. Renal cystic lesions, incompletely characterized.

## 2023-06-14 ENCOUNTER — Other Ambulatory Visit: Payer: Self-pay

## 2023-06-14 ENCOUNTER — Other Ambulatory Visit (HOSPITAL_BASED_OUTPATIENT_CLINIC_OR_DEPARTMENT_OTHER): Payer: Self-pay | Admitting: Family Medicine

## 2023-06-14 ENCOUNTER — Other Ambulatory Visit (HOSPITAL_COMMUNITY): Payer: Self-pay

## 2023-06-14 MED ORDER — ATORVASTATIN CALCIUM 40 MG PO TABS
40.0000 mg | ORAL_TABLET | Freq: Every day | ORAL | 0 refills | Status: DC
  Filled 2023-06-14 – 2023-07-08 (×4): qty 90, 90d supply, fill #0

## 2023-06-15 ENCOUNTER — Other Ambulatory Visit (HOSPITAL_BASED_OUTPATIENT_CLINIC_OR_DEPARTMENT_OTHER): Payer: Self-pay

## 2023-06-16 ENCOUNTER — Other Ambulatory Visit (HOSPITAL_COMMUNITY): Payer: Self-pay

## 2023-06-24 ENCOUNTER — Other Ambulatory Visit (HOSPITAL_COMMUNITY): Payer: Self-pay

## 2023-06-27 ENCOUNTER — Other Ambulatory Visit (HOSPITAL_BASED_OUTPATIENT_CLINIC_OR_DEPARTMENT_OTHER): Payer: Self-pay | Admitting: Family Medicine

## 2023-06-27 ENCOUNTER — Ambulatory Visit (HOSPITAL_COMMUNITY): Attending: Family

## 2023-06-27 ENCOUNTER — Other Ambulatory Visit (HOSPITAL_COMMUNITY): Payer: Self-pay

## 2023-06-27 DIAGNOSIS — I7121 Aneurysm of the ascending aorta, without rupture: Secondary | ICD-10-CM | POA: Insufficient documentation

## 2023-06-27 DIAGNOSIS — E1169 Type 2 diabetes mellitus with other specified complication: Secondary | ICD-10-CM

## 2023-06-27 DIAGNOSIS — G4739 Other sleep apnea: Secondary | ICD-10-CM

## 2023-06-27 DIAGNOSIS — G4730 Sleep apnea, unspecified: Secondary | ICD-10-CM

## 2023-06-27 LAB — ECHOCARDIOGRAM COMPLETE
Area-P 1/2: 2.69 cm2
S' Lateral: 3 cm

## 2023-06-27 MED ORDER — METFORMIN HCL 1000 MG PO TABS
1000.0000 mg | ORAL_TABLET | Freq: Two times a day (BID) | ORAL | 0 refills | Status: DC
  Filled 2023-06-27 – 2023-07-08 (×6): qty 180, 90d supply, fill #0

## 2023-06-27 NOTE — Telephone Encounter (Signed)
 Received another message from the patient via mychart: Hi Lance Davis, i'm using this forum because I cannot find a way to message you directly.I haven't had cpap supplies in awhile. How can I get hose ordered? All my current equipment is old.  Can you give me some direction on this?     Advised patient I have forwarded his message to our clinical staff  Please advise

## 2023-06-28 ENCOUNTER — Encounter (HOSPITAL_BASED_OUTPATIENT_CLINIC_OR_DEPARTMENT_OTHER): Payer: Self-pay | Admitting: Family Medicine

## 2023-06-28 ENCOUNTER — Other Ambulatory Visit (HOSPITAL_BASED_OUTPATIENT_CLINIC_OR_DEPARTMENT_OTHER): Payer: Self-pay

## 2023-06-28 ENCOUNTER — Encounter (HOSPITAL_BASED_OUTPATIENT_CLINIC_OR_DEPARTMENT_OTHER): Payer: Self-pay

## 2023-06-28 DIAGNOSIS — I7121 Aneurysm of the ascending aorta, without rupture: Secondary | ICD-10-CM

## 2023-06-29 ENCOUNTER — Encounter (HOSPITAL_BASED_OUTPATIENT_CLINIC_OR_DEPARTMENT_OTHER): Payer: Self-pay | Admitting: *Deleted

## 2023-06-29 ENCOUNTER — Other Ambulatory Visit (HOSPITAL_BASED_OUTPATIENT_CLINIC_OR_DEPARTMENT_OTHER): Payer: Self-pay

## 2023-06-29 ENCOUNTER — Other Ambulatory Visit (HOSPITAL_COMMUNITY): Payer: Self-pay

## 2023-06-29 ENCOUNTER — Other Ambulatory Visit: Payer: Self-pay

## 2023-06-29 ENCOUNTER — Telehealth: Payer: Self-pay | Admitting: Pharmacist

## 2023-06-29 ENCOUNTER — Telehealth (HOSPITAL_BASED_OUTPATIENT_CLINIC_OR_DEPARTMENT_OTHER): Admitting: Family Medicine

## 2023-06-29 ENCOUNTER — Encounter (HOSPITAL_BASED_OUTPATIENT_CLINIC_OR_DEPARTMENT_OTHER): Payer: Self-pay | Admitting: Family Medicine

## 2023-06-29 DIAGNOSIS — Z7184 Encounter for health counseling related to travel: Secondary | ICD-10-CM | POA: Diagnosis not present

## 2023-06-29 MED ORDER — AZITHROMYCIN 250 MG PO TABS
500.0000 mg | ORAL_TABLET | Freq: Every day | ORAL | 0 refills | Status: AC
  Filled 2023-06-29: qty 6, 3d supply, fill #0

## 2023-06-29 NOTE — Progress Notes (Signed)
   Virtual Visit   I connected with  Lance Davis  on 06/29/23 by telehealth and verified that I am speaking with the correct person using two identifiers. Visit completed via video.   I discussed the limitations, risks, security and privacy concerns of performing an evaluation and management service by telephone, including the higher likelihood of inaccurate diagnosis and treatment, and the availability of in person appointments.  We also discussed the likely need of an additional face to face encounter for complete and high quality delivery of care.  I also discussed with the patient that there may be a patient responsible charge related to this service. The patient expressed understanding and wishes to proceed.  Provider location is in medical facility. Patient location is at their home, different from provider location. People involved in care of the patient during this telehealth encounter were myself, my nurse/medical assistant, and my front office/scheduling team member.  Review of Systems: No fevers, chills, night sweats, weight loss, chest pain, or shortness of breath.   Objective Findings:    General: Speaking full sentences, no audible heavy breathing.  Sounds alert and appropriately interactive.    Independent interpretation of tests performed by another provider:   None.  Brief History, Exam, Impression, and Recommendations:    Counseling for travel Patient will be traveling to [place] in the near future and has concerns about possible environmental exposure, foodborne illness.  Primary concerns related to possible traveler's diarrhea.  We discussed considerations related to this and role of prophylaxis versus having medication available for treatment.  He would like to have medication available for treatment should he develop any issues.  We discussed precautions, particularly that if he is having diarrhea with bloody bowel movements or having associated fevers, he  would need to be evaluated medically as opposed to just utilizing antibiotics sent with him.  Patient voices understanding regarding this.  It will be important to ensure adequate hydration if any diarrhea does develop.  If he does need to utilize antibiotics, still would seek medical care over to ensure no other issues or concerns.  Patient voiced understanding  I discussed the above assessment and treatment plan with the patient. The patient was provided an opportunity to ask questions and all were answered. The patient agreed with the plan and demonstrated an understanding of the instructions.   The patient was advised to call back or seek an in-person evaluation if the symptoms worsen or if the condition fails to improve as anticipated.   I provided 8 minutes of face to face and non-face-to-face time during this encounter date, time was needed to gather information, review chart, records, communicate/coordinate with staff remotely, as well as complete documentation.   ___________________________________________ Amr Sturtevant de Peru, MD, ABFM, CAQSM Primary Care and Sports Medicine [facility]

## 2023-06-29 NOTE — Assessment & Plan Note (Signed)
 Patient will be traveling to [place] in the near future and has concerns about possible environmental exposure, foodborne illness.  Primary concerns related to possible traveler's diarrhea.  We discussed considerations related to this and role of prophylaxis versus having medication available for treatment.  He would like to have medication available for treatment should he develop any issues.  We discussed precautions, particularly that if he is having diarrhea with bloody bowel movements or having associated fevers, he would need to be evaluated medically as opposed to just utilizing antibiotics sent with him.  Patient voices understanding regarding this.  It will be important to ensure adequate hydration if any diarrhea does develop.  If he does need to utilize antibiotics, still would seek medical care over to ensure no other issues or concerns.  Patient voiced understanding

## 2023-06-29 NOTE — Progress Notes (Signed)
   06/29/2023  Patient ID: Lance Davis, male   DOB: Dec 11, 1955, 68 y.o.   MRN: [id number]  Patient appeared on insurance report for not passing the quality metrics in 2024: Medication Adherence for Diabetes (MAD)   Outreach to the patient was Successful   Called and spoke with the patient on the phone today regarding Mounjaro. Reports he had some nausea with it previously so he stopped it. Then restarted the medication about 2 weeks ago and has been doing well since with only mild nausea issues.  Reports he will need a refill and worries about needing a PA. Leaving for [place] on 07/03/23. Meds look 13 days past due, but patient reports taking medications as prescribed.  Called pharmacy and had all of his medications refilled since they are due and the Losartan was ready on 06/17/23 and Metformin on 3/24. Had medications synced up previously. They are filling everything today and will mail them to him- should be there by tomorrow afternoon. Greggory Keen has a $47 copay and no PA needed.   Ricka Burdock, PharmD [facility]  Phone Number: [phone]

## 2023-06-30 ENCOUNTER — Ambulatory Visit (HOSPITAL_BASED_OUTPATIENT_CLINIC_OR_DEPARTMENT_OTHER): Payer: Medicare HMO | Admitting: Cardiovascular Disease

## 2023-06-30 ENCOUNTER — Encounter (HOSPITAL_BASED_OUTPATIENT_CLINIC_OR_DEPARTMENT_OTHER): Payer: Self-pay | Admitting: Cardiovascular Disease

## 2023-06-30 VITALS — BP 124/82 | HR 64 | Ht 74.5 in | Wt 248.0 lb

## 2023-06-30 DIAGNOSIS — I10 Essential (primary) hypertension: Secondary | ICD-10-CM

## 2023-06-30 DIAGNOSIS — I7121 Aneurysm of the ascending aorta, without rupture: Secondary | ICD-10-CM

## 2023-06-30 DIAGNOSIS — E119 Type 2 diabetes mellitus without complications: Secondary | ICD-10-CM | POA: Diagnosis not present

## 2023-06-30 DIAGNOSIS — G4739 Other sleep apnea: Secondary | ICD-10-CM

## 2023-06-30 DIAGNOSIS — I700 Atherosclerosis of aorta: Secondary | ICD-10-CM | POA: Diagnosis not present

## 2023-06-30 NOTE — Progress Notes (Signed)
 Cardiology Office Note:  .   Date:  06/30/2023  ID:  Lance Davis, DOB Aug 04, 1955, MRN [id number] PCP: de Peru, [name], MD  [facility] Providers Cardiologist:  Chilton Si, MD    History of Present Illness: [name] Kitchen    Lance Davis is a 68 y.o. male with a hx of coronary calcification, hypertension, diabetes, OSA, and mild dilated ascending aorta here for follow up.  He was first seen 11/2021 for chest pain.  On 10/2021 he was playing golf and felt chest tightness and dizziness. He saw his PCP the following day and still had chest discomfort. EKG was unremarkable. He was referred to cardiology for further evaluation. He previously had a calcium score in 2011 with a score of 145 which was 62nd percentile. He had a mild ascending aortic aneurysm at 4.1 cm. Chest CT 02/2021 was unchanged.   Lance Davis' most recent echo 06/2023 revealed LVEF 60-65% with mild LVH and normal diastolic function.  The ascending aorta was 4.2 cm.  Discussed the use of AI scribe software for clinical note transcription with the patient, who gave verbal consent to proceed.  History of Present Illness Lance Davis is cautious about not straining during physical activities due to lifting restrictions. He exercises regularly, including gym workouts, cycling, and yoga, and feels good during these activities. His family history includes aortic valve transplants in his father and uncle.  In January, he experienced an episode of hyponatremia while in [place], with sodium levels dropping to the low 120s. This was associated with overall discomfort and tightness, leading him to the emergency room. He denies any prior similar episodes and has not experienced these symptoms since his sodium levels normalized. He was not on any diuretics or experiencing swelling at the time. He was active during his trip but did not excessively drink water.  He is on losartan 50 mg for hypertension, which he adjusted from 100 mg after  losing weight. He experiences occasional dizziness when standing, particularly after aerobic exercise, and monitors his blood pressure at home, which is usually around 130/80 mmHg.  He is on atorvastatin for cholesterol management and reports his LDL levels as well-controlled. He engages in regular physical activity, including gym workouts, cycling, and yoga, and feels good during these activities.  He has no current symptoms of swelling, excessive fluid intake, or medication use that could have contributed to his previous hyponatremia. No current chest pain, shortness of breath, or swelling.  ROS:  As per HPI  Studies Reviewed: .       Echo 06/2023: 1. Left ventricular ejection fraction, by estimation, is 60 to 65%. The  left ventricle has normal function. The left ventricle has no regional  wall motion abnormalities. There is mild left ventricular hypertrophy.  Left ventricular diastolic parameters  were normal. The average left ventricular global longitudinal strain is  -20.1 %. The global longitudinal strain is normal.   2. Right ventricular systolic function is normal. The right ventricular  size is normal.   3. Left atrial size was mild to moderately dilated.   4. The mitral valve is normal in structure. No evidence of mitral valve  regurgitation. No evidence of mitral stenosis.   5. The aortic valve is tricuspid. Aortic valve regurgitation is trivial.  No aortic stenosis is present.   6. Aortic dilatation noted. There is mild dilatation of the ascending  aorta, measuring 42 mm.   7. The inferior vena cava is normal in size with greater  than 50%  respiratory variability, suggesting right atrial pressure of 3 mmHg.   Risk Assessment/Calculations:             Physical Exam:   VS:  BP 124/82   Pulse 64   Ht 6' 2.5" (1.892 m)   Wt 248 lb (112.5 kg)   SpO2 98%   BMI 31.42 kg/m  , BMI Body mass index is 31.42 kg/m. GENERAL:  Well appearing HEENT: Pupils equal round and  reactive, fundi not visualized, oral mucosa unremarkable NECK:  No jugular venous distention, waveform within normal limits, carotid upstroke brisk and symmetric, no bruits, no thyromegaly LUNGS:  Clear to auscultation bilaterally HEART:  RRR.  PMI not displaced or sustained,S1 and S2 within normal limits, no S3, no S4, no clicks, no rubs, no murmurs ABD:  Flat, positive bowel sounds normal in frequency in pitch, no bruits, no rebound, no guarding, no midline pulsatile mass, no hepatomegaly, no splenomegaly EXT:  2 plus pulses throughout, no edema, no cyanosis no clubbing SKIN:  No rashes no nodules NEURO:  Cranial nerves II through XII grossly intact, motor grossly intact throughout PSYCH:  Cognitively intact, oriented to person place and time   ASSESSMENT AND PLAN: .    Assessment & Plan # Mildly dilated ascending aorta.   Mildly dilated ascending aorta at 4.2 cm, stable. No aortic valve involvement.  According to the [facility] aorta calculator hs Z-score is 1.65.  This is consistent with mild dilatation.  It has been very stable over time.  Given that his father and grandfather had issues with the aorta and aortic valve, it is reasonable to be cautious.  Maintain BP <130/80.  No beta blocker 2/2 bradycardia.  No specific weigh restriction but avoid lifting things so heavy that he has to grunt.  Emphasized blood pressure control and smoking cessation to prevent further dilation. - Repeat echocardiogram in two years. - Avoid heavy lifting and straining. - Maintain blood pressure control. - Avoid smoking. -No beta blocker due to bradycardia.  # Hypertension Blood pressure well-controlled with losartan 50 mg. Dizziness during exercise likely due to medication timing. Advised nighttime dosing to minimize daytime dizziness. - Continue losartan 50 mg at night. - Monitor blood pressure regularly. - Report any changes in symptoms or blood pressure control.  #  Hyperlipidemia LDL levels  well-controlled with atorvastatin. No changes needed. - Continue atorvastatin as prescribed. - Follow up with primary care for cholesterol check in May.  # Hyponatremia Resolved episode in January, likely due to water intoxication or SIADH. No current risk factors for recurrence. - Monitor for recurrence of symptoms. - Consider nephrology referral if hyponatremia recurs. -avoid thiazide diuretics  Follow-up Well-managed cardiovascular status. Emphasized monitoring for symptoms such as chest pain, shortness of breath, or uncontrolled blood pressure. - Follow up after echocardiogram in two years unless symptoms change. - Contact cardiology if experiencing chest pain, shortness of breath, or uncontrolled blood pressure.    Dispo: f/u in 2 years  Signed, Chilton Si, MD

## 2023-06-30 NOTE — Patient Instructions (Signed)
 Medication Instructions:  Your physician recommends that you continue on your current medications as directed. Please refer to the Current Medication list given to you today.  Testing/Procedures: Your physician has requested that you have an echocardiogram in 2 years . Echocardiography is a painless test that uses sound waves to create images of your heart. It provides your doctor with information about the size and shape of your heart and how well your heart's chambers and valves are working. This procedure takes approximately one hour. There are no restrictions for this procedure. Please do NOT wear cologne, perfume, aftershave, or lotions (deodorant is allowed). Please arrive 15 minutes prior to your appointment time.  Please note: We ask at that you not bring children with you during ultrasound (echo/ vascular) testing. Due to room size and safety concerns, children are not allowed in the ultrasound rooms during exams. Our front office staff cannot provide observation of children in our lobby area while testing is being conducted. An adult accompanying a patient to their appointment will only be allowed in the ultrasound room at the discretion of the ultrasound technician under special circumstances. We apologize for any inconvenience.   Follow-Up: At [facility], you and your health needs are our priority.  As part of our continuing mission to provide you with exceptional heart care, we have created designated Provider Care Teams.  These Care Teams include your primary Cardiologist (physician) and Advanced Practice Providers (APPs -  Physician Assistants and Nurse Practitioners) who all work together to provide you with the care you need, when you need it.  We recommend signing up for the patient portal called "MyChart".  Sign up information is provided on this After Visit Summary.  MyChart is used to connect with patients for Virtual Visits (Telemedicine).  Patients are able to view  lab/test results, encounter notes, upcoming appointments, etc.  Non-urgent messages can be sent to your provider as well.   To learn more about what you can do with MyChart, go to [email or web address].    Your next appointment:   2 years with Dr. Duke Salvia after echo

## 2023-07-01 ENCOUNTER — Telehealth (HOSPITAL_BASED_OUTPATIENT_CLINIC_OR_DEPARTMENT_OTHER): Admitting: Family Medicine

## 2023-07-04 ENCOUNTER — Encounter (HOSPITAL_BASED_OUTPATIENT_CLINIC_OR_DEPARTMENT_OTHER): Payer: Self-pay | Admitting: Cardiovascular Disease

## 2023-07-08 ENCOUNTER — Other Ambulatory Visit (HOSPITAL_COMMUNITY): Payer: Self-pay

## 2023-07-08 ENCOUNTER — Other Ambulatory Visit: Payer: Self-pay

## 2023-07-08 MED ORDER — FINASTERIDE 5 MG PO TABS
5.0000 mg | ORAL_TABLET | Freq: Every day | ORAL | 0 refills | Status: DC
  Filled 2023-07-08 (×2): qty 90, 90d supply, fill #0

## 2023-07-12 ENCOUNTER — Other Ambulatory Visit (HOSPITAL_BASED_OUTPATIENT_CLINIC_OR_DEPARTMENT_OTHER): Payer: Self-pay

## 2023-07-12 ENCOUNTER — Other Ambulatory Visit: Payer: Self-pay | Admitting: *Deleted

## 2023-07-12 DIAGNOSIS — G4733 Obstructive sleep apnea (adult) (pediatric): Secondary | ICD-10-CM | POA: Diagnosis not present

## 2023-08-03 ENCOUNTER — Ambulatory Visit: Payer: Medicare HMO | Admitting: Nurse Practitioner

## 2023-08-15 ENCOUNTER — Encounter (HOSPITAL_BASED_OUTPATIENT_CLINIC_OR_DEPARTMENT_OTHER): Payer: Self-pay | Admitting: *Deleted

## 2023-08-19 DIAGNOSIS — E119 Type 2 diabetes mellitus without complications: Secondary | ICD-10-CM | POA: Diagnosis not present

## 2023-08-19 DIAGNOSIS — H53143 Visual discomfort, bilateral: Secondary | ICD-10-CM | POA: Diagnosis not present

## 2023-08-19 DIAGNOSIS — H0011 Chalazion right upper eyelid: Secondary | ICD-10-CM | POA: Diagnosis not present

## 2023-08-19 DIAGNOSIS — H25013 Cortical age-related cataract, bilateral: Secondary | ICD-10-CM | POA: Diagnosis not present

## 2023-08-19 LAB — HM DIABETES EYE EXAM

## 2023-08-23 ENCOUNTER — Encounter (HOSPITAL_BASED_OUTPATIENT_CLINIC_OR_DEPARTMENT_OTHER): Payer: Self-pay | Admitting: *Deleted

## 2023-09-01 ENCOUNTER — Encounter (HOSPITAL_BASED_OUTPATIENT_CLINIC_OR_DEPARTMENT_OTHER): Payer: Self-pay | Admitting: Family Medicine

## 2023-09-01 ENCOUNTER — Ambulatory Visit (HOSPITAL_BASED_OUTPATIENT_CLINIC_OR_DEPARTMENT_OTHER): Payer: HMO | Admitting: Family Medicine

## 2023-09-01 VITALS — BP 115/76 | HR 94 | Ht 74.5 in | Wt 235.2 lb

## 2023-09-01 DIAGNOSIS — M25512 Pain in left shoulder: Secondary | ICD-10-CM

## 2023-09-01 DIAGNOSIS — I10 Essential (primary) hypertension: Secondary | ICD-10-CM

## 2023-09-01 DIAGNOSIS — E119 Type 2 diabetes mellitus without complications: Secondary | ICD-10-CM

## 2023-09-01 DIAGNOSIS — Z7984 Long term (current) use of oral hypoglycemic drugs: Secondary | ICD-10-CM | POA: Diagnosis not present

## 2023-09-01 NOTE — Assessment & Plan Note (Signed)
 Patient continues with losartan  as prescribed.  He reports that he did have an episode where he was exercising at the gym and noted some lightheadedness and felt as though he may pass out.  He ultimately did not lose consciousness at that time, however did check close blood pressure when he got home and reports that it was 100/60.  He does wonder if medication should be adjusted with him being more active and blood pressure running lower.  He denies any issues with chest pain or headaches. Blood pressure is appropriate in office today.  We discussed considerations related to medication management.  We can look to utilize half dose of losartan , reducing it to 25 mg daily.  Advised on monitoring blood pressure closely this change and if blood pressure remains appropriately controlled, can continue with lower dose of losartan .  If he is continuing with focus on lifestyle modifications and blood pressure continues to run lower, further adjustments in pharmacotherapy can be explored.

## 2023-09-01 NOTE — Patient Instructions (Signed)
  Medication Instructions:  Your physician recommends that you continue on your current medications as directed. Please refer to the Current Medication list given to you today. --If you need a refill on any your medications before your next appointment, please call your pharmacy first. If no refills are authorized on file call the office.--   Referrals/Procedures/Imaging: Physical Therapy   Follow-Up: Your next appointment:   Your physician recommends that you schedule a follow-up appointment in: 3-4 month follow up  with Dr. de Peru  You will receive a text message or e-mail with a link to a survey about your care and experience with us  today! We would greatly appreciate your feedback!   Thanks for letting us  be apart of your health journey!!  Primary Care and Sports Medicine   Dr. Court Distance Peru   We encourage you to activate your patient portal called "MyChart".  Sign up information is provided on this After Visit Summary.  MyChart is used to connect with patients for Virtual Visits (Telemedicine).  Patients are able to view lab/test results, encounter notes, upcoming appointments, etc.  Non-urgent messages can be sent to your provider as well. To learn more about what you can do with MyChart, please visit --  [email or web address].

## 2023-09-01 NOTE — Assessment & Plan Note (Signed)
 Generally doing well, continues with Mounjaro  and metformin .  Denies any significant issues with medication. A1c has previously been very well-controlled. We will plan to recheck A1c around time of next office visit. No medication changes today.  Plan to complete foot exam at future visit

## 2023-09-01 NOTE — Assessment & Plan Note (Signed)
 Intermittently, patient will have some shoulder pain.  He does have reported history of prior dislocation.  No issues with numbness or tingling.  Has had occasional popping, this is not painful when it occurs.  At prior evaluation, did discuss considerations related to home exercise regimen, physical therapy.  At this time, he would like to proceed with referral to PT, placed today.

## 2023-09-01 NOTE — Progress Notes (Signed)
    Procedures performed today:    None.  Independent interpretation of notes and tests performed by another provider:   None.  Brief History, Exam, Impression, and Recommendations:    BP 115/76 (BP Location: Right Arm, Patient Position: Sitting, Cuff Size: Normal)   Pulse 94   Ht 6' 2.5" (1.892 m)   Wt 235 lb 3.2 oz (106.7 kg)   SpO2 94%   BMI 29.79 kg/m   Left shoulder pain, unspecified chronicity Assessment & Plan: Intermittently, patient will have some shoulder pain.  He does have reported history of prior dislocation.  No issues with numbness or tingling.  Has had occasional popping, this is not painful when it occurs.  At prior evaluation, did discuss considerations related to home exercise regimen, physical therapy.  At this time, he would like to proceed with referral to PT, placed today.  Orders: -     Ambulatory referral to Physical Therapy  Primary hypertension Assessment & Plan: Patient continues with losartan  as prescribed.  He reports that he did have an episode where he was exercising at the gym and noted some lightheadedness and felt as though he may pass out.  He ultimately did not lose consciousness at that time, however did check close blood pressure when he got home and reports that it was 100/60.  He does wonder if medication should be adjusted with him being more active and blood pressure running lower.  He denies any issues with chest pain or headaches. Blood pressure is appropriate in office today.  We discussed considerations related to medication management.  We can look to utilize half dose of losartan , reducing it to 25 mg daily.  Advised on monitoring blood pressure closely this change and if blood pressure remains appropriately controlled, can continue with lower dose of losartan .  If he is continuing with focus on lifestyle modifications and blood pressure continues to run lower, further adjustments in pharmacotherapy can be explored.   Type 2 diabetes  mellitus without complication, without long-term current use of insulin (HCC) Assessment & Plan: Generally doing well, continues with Mounjaro  and metformin .  Denies any significant issues with medication. A1c has previously been very well-controlled. We will plan to recheck A1c around time of next office visit. No medication changes today.  Plan to complete foot exam at future visit   Return in about 3 months (around 12/02/2023) for diabetes, hypertension.   ___________________________________________ Shaun Zuccaro de Peru, MD, ABFM, CAQSM Primary Care and Sports Medicine [facility]

## 2023-09-07 DIAGNOSIS — N5201 Erectile dysfunction due to arterial insufficiency: Secondary | ICD-10-CM | POA: Diagnosis not present

## 2023-09-07 DIAGNOSIS — R350 Frequency of micturition: Secondary | ICD-10-CM | POA: Diagnosis not present

## 2023-09-07 DIAGNOSIS — N401 Enlarged prostate with lower urinary tract symptoms: Secondary | ICD-10-CM | POA: Diagnosis not present

## 2023-09-15 ENCOUNTER — Other Ambulatory Visit (HOSPITAL_COMMUNITY): Payer: Self-pay

## 2023-09-16 ENCOUNTER — Other Ambulatory Visit (HOSPITAL_BASED_OUTPATIENT_CLINIC_OR_DEPARTMENT_OTHER): Payer: Self-pay

## 2023-09-16 ENCOUNTER — Other Ambulatory Visit: Payer: Self-pay

## 2023-09-16 ENCOUNTER — Other Ambulatory Visit (HOSPITAL_COMMUNITY): Payer: Self-pay

## 2023-09-16 ENCOUNTER — Telehealth: Payer: Self-pay | Admitting: *Deleted

## 2023-09-16 NOTE — Telephone Encounter (Signed)
 Copied from CRM [id number]. Topic: Clinical - Medical Advice >> Sep 16, 2023  8:53 AM Lance Davis C wrote: Reason for CRM: Patient [phone] states is out of town, forgot to bring cpap machine, and needs helps renting a cpap machine. Patient would need a prescription order can be email or fax, also has accesss to Mychart. Patient is in [place], [place] until next Wednesday. Patient denies any symptoms. Please advise and call back today.    ? Where does he plan to rent from? We would need to know where to sent the order to. I called the pt and there was no answer- LMTCB.

## 2023-09-19 ENCOUNTER — Other Ambulatory Visit: Payer: Self-pay

## 2023-09-19 ENCOUNTER — Other Ambulatory Visit (HOSPITAL_BASED_OUTPATIENT_CLINIC_OR_DEPARTMENT_OTHER): Payer: Self-pay

## 2023-09-19 NOTE — Telephone Encounter (Signed)
 Handles in [email or web address]

## 2023-09-20 ENCOUNTER — Other Ambulatory Visit: Payer: Self-pay

## 2023-09-20 ENCOUNTER — Other Ambulatory Visit (HOSPITAL_BASED_OUTPATIENT_CLINIC_OR_DEPARTMENT_OTHER): Payer: Self-pay

## 2023-09-22 ENCOUNTER — Ambulatory Visit: Admitting: Nurse Practitioner

## 2023-09-22 ENCOUNTER — Encounter: Payer: Self-pay | Admitting: Nurse Practitioner

## 2023-09-22 VITALS — BP 118/84 | HR 63 | Ht 74.0 in | Wt 238.8 lb

## 2023-09-22 DIAGNOSIS — G4739 Other sleep apnea: Secondary | ICD-10-CM | POA: Diagnosis not present

## 2023-09-22 DIAGNOSIS — G4730 Sleep apnea, unspecified: Secondary | ICD-10-CM

## 2023-09-22 DIAGNOSIS — F5101 Primary insomnia: Secondary | ICD-10-CM | POA: Diagnosis not present

## 2023-09-22 NOTE — Assessment & Plan Note (Signed)
 Stable on current regimen. No aberrant behavior. Doing well with ambien . No side effects. Sleep hygiene reviewed.

## 2023-09-22 NOTE — Assessment & Plan Note (Addendum)
 Severe mixed sleep apnea, on CPAP. Excellent compliance and control. Receives benefit from use. Aware of proper care/use of device. Understands risks of untreated OSA. Encouraged to continue utilizing nightly. Aware of proper care/use of device. Safe driving practices reviewed. Order placed for new machine.   Patient Instructions  Continue to use CPAP every night, minimum of 4-6 hours a night.  Change equipment every 30 days or as directed by DME. Wash your tubing with warm soap and water daily, hang to dry. Wash humidifier portion weekly. Use bottled, distilled water and change daily  Be aware of reduced alertness and do not drive or operate heavy machinery if experiencing this or drowsiness.  Notify if persistent daytime sleepiness occurs even with consistent use of CPAP.   Continue ambien  10 mg At bedtime as needed for sleep. Take within 30 minutes of intended sleep onset, with plans for 7-8 hours of sleep. If the 5 mg does not help you fall asleep within 30 min-1 hr, you can take an additional 5 mg. Do not drive after taking. Stop and notify if any abnormal sleep habits or mood changes develop    Order placed for new CPAP machine  Make sure to keep a SD card in your old machine if you[place] it at the beach and bring this to your follow up appointments   Follow up in 10-12 weeks with Alston Jerry Daneesha Quinteros,NP, or sooner, if needed

## 2023-09-22 NOTE — Progress Notes (Signed)
 @Patient  ID: Lance Davis, male    DOB: 03-30-1956, 68 y.o.   MRN: [id number]  Chief Complaint  Patient presents with   Follow-up    Referring provider: de Peru, Alonza Jansky, MD  HPI: 68 year old male, never smoker followed for sleep apnea on OSA and insomnia. Past medical history significant for severe complex sleep apnea on CPAP, HTN, thoracic aneurysm, BPH, HLD.   TEST/EVENTS:  11/16/2011 PSG: AHI 35.6/h. Mixed central and obstructive. Sleep markedly fragmented throughout the night except interval between 1:30-2:30 am.   11/01/2022: Ov with Jayde Mcallister NP for sleep consult, referred by Dr. De Peru. He has a long history of sleep apnea. Last sleep study 2013 with severe sleep apnea, mixed central and obstructive events.  He wears his CPAP nightly.  Does not feel like he could sleep without it.  Receives benefit from using it.  Wakes feeling rested most mornings.  He does still have some difficulties with sleep onset.  Sometimes it can take him an hour or longer to fall asleep but other nights he does not have as much trouble.  He has tried melatonin which has been relatively unsuccessful for him.  Denies any morning headaches, drowsy driving, sleep parasomnia/paralysis. Goes to bed between 10 PM and midnight.  Sleep latency varies.  Wakes 1-4 times a night.  Usually gets up around 6 to 8 AM.  His weight is down 50 pounds over the last 2 years.  Last sleep study was in 2013.  He wears a nasal mask.  Curious if there are any other alternative nasal mask options that he could wear that would not go over the bridge of his nose.  Feels like his pressures are set appropriately.  No significant leaks.  Does not wear any supplemental oxygen.  Download shows excellent compliance with 100% greater than 4 hours of use and with residual AHI 0.4. He has a history of high blood pressure and diabetes.  No history of stroke.  He is a never smoker.  Drinks 1-3 alcoholic beverages 5 nights a week.  No current sleep  aids.  No excessive caffeine intake.  Lives with his spouse.  He is retired.  Does operate a motor vehicle but no other heavy machinery.  Family history of heart disease. [place] 4  02/03/2023: OV with Jourdan Maldonado NP for follow up. He was started on ambien  at our last visit for chronic insomniac symptoms. This has been working well for him for the most part. He doesn't have to use it every night. It usually helps him fall and stay asleep when he does take it. He does have an occasional night where the 5 mg doesn't seem to do anything for him and he lies awake for a while. He denies any sleep parasomnias/paralysis. Energy levels better during the day. Wearing his CPAP nightly. He does feel like he sleeps and breathes better with it. No morning headaches or drowsy driving. No significant leaks.  01/02/2023-01/31/2023 CPAP 5-11 cmH2O 30/30 days; 97% >4 hr; average use 6 hr 43 min Pressure 95th 8.6 Leaks 95th 32.8 AHI 1.1  09/22/2023: Today - follow up Patient presents today for follow up. Doing well on CPAP. Forgot his machine at home when he went to the beach last week but was able to get a loaner from [employer]. He's back home and back on his personal machine. He would like to get a new one. Sleeps well. Energy levels are good. No drowsy driving or sleep parasomnias/paralysis.  Using  ambien  some nights. It does help him fall asleep most of the time. No issues with mood changes. No morning hangover.   08/22/2023-09/20/2023: CPAP 5.4-11.6 24/30 days; 80% >4 hr; average use 6 hr 35 min Pressure 95th 8.7 Leaks 95th 25 AHI 0.7  No Known Allergies  Immunization History  Administered Date(s) Administered   Fluad  Quad(high Dose 65+) 01/04/2022   Fluad  Trivalent(High Dose 65+) 12/21/2022   Moderna SARS-COV2 Booster Vaccination 10/08/2020   PFIZER(Purple Top)SARS-COV-2 Vaccination 01/11/2020   Pfizer Covid-19 Vaccine Bivalent Booster 35yrs & up 12/15/2020   Pfizer(Comirnaty )Fall Seasonal Vaccine 12 years and  older 04/14/2022, 12/21/2022   Tetanus 04/05/2014   Zoster Recombinant(Shingrix) 03/30/2017, 06/01/2017, 07/01/2017    Past Medical History:  Diagnosis Date   Allergy    spring time   Aortic atherosclerosis (HCC) 11/11/2021   Arthritis    Atypical nevus 04/26/2016   mild - right mid back   Blood transfusion 1963   Cancer (HCC) 2019   melanoma face   Clark level II melanoma (HCC) 12/13/2017   left inner cheek   Palpitations 11/11/2021   Sleep apnea    cpap   Squamous cell carcinoma in situ of skin 04/26/2016   inner left cheek    Tobacco History: Social History   Tobacco Use  Smoking Status Never   Passive exposure: Never  Smokeless Tobacco Never   Counseling given: Not Answered   Outpatient Medications Prior to Visit  Medication Sig Dispense Refill   atorvastatin  (LIPITOR) 40 MG tablet Take 1 tablet (40 mg total) by mouth daily. 90 tablet 0   finasteride  (PROSCAR ) 5 MG tablet Take 1 tablet (5 mg total) by mouth daily. 90 tablet 0   losartan  (COZAAR ) 50 MG tablet Take 1 tablet (50 mg total) by mouth daily. 90 tablet 3   metFORMIN  (GLUCOPHAGE ) 1000 MG tablet Take 1 tablet (1,000 mg total) by mouth 2 (two) times daily with a meal. 180 tablet 0   tamsulosin  (FLOMAX ) 0.4 MG CAPS capsule Take 1 capsule (0.4 mg total) by mouth 2 (two) times daily. 180 capsule 1   tirzepatide  (MOUNJARO ) 5 MG/0.5ML Pen Inject 5 mg into the skin once a week. 6 mL 1   zolpidem  (AMBIEN ) 5 MG tablet Take 2 tablets (10 mg total) by mouth at bedtime as needed for sleep. 60 tablet 5   No facility-administered medications prior to visit.     Review of Systems:   Constitutional: No night sweats, fevers, chills, or lassitude. +occasional fatigue, intentional weight loss HEENT: No headaches, difficulty swallowing, tooth/dental problems, or sore throat. No sneezing, itching, ear ache +occasional nasal congestion (baseline) CV:  No chest pain, orthopnea, PND, swelling in lower extremities, anasarca,  dizziness, palpitations, syncope Resp: No shortness of breath with exertion or at rest. No cough GI:  No heartburn, indigestion GU: No nocturia Skin: No rash, lesions, ulcerations MSK:  No joint pain or swelling.   Neuro: No dizziness or lightheadedness.  Psych: No depression or anxiety. Mood stable. +sleep disturbance (improved)    Physical Exam:  BP 118/84 (BP Location: Right Arm, Patient Position: Sitting, Cuff Size: Large)   Pulse 63   Ht 6' 2 (1.88 m)   Wt 238 lb 12.8 oz (108.3 kg)   SpO2 97%   BMI 30.66 kg/m   GEN: Pleasant, interactive, well-appearing; obese; in no acute distress. HEENT:  Normocephalic and atraumatic. PERRLA. Sclera white. Nasal turbinates pink, moist and patent bilaterally. No rhinorrhea present. Oropharynx pink and moist, without exudate or edema. No  lesions, ulcerations, or postnasal drip. Mallampati III NECK:  Supple w/ fair ROM. No lymphadenopathy.   CV: RRR, no m/r/g, no peripheral edema. Pulses intact, +2 bilaterally. No cyanosis, pallor or clubbing. PULMONARY:  Unlabored, regular breathing. Clear bilaterally A&P w/o wheezes/rales/rhonchi. No accessory muscle use.  GI: BS present and normoactive. Soft, non-tender to palpation.  MSK: No erythema, warmth or tenderness. Cap refil <2 sec all extrem. Neuro: A/Ox3. No focal deficits noted.   Skin: Warm, no lesions or rashe Psych: Normal affect and behavior. Judgement and thought content appropriate.     Lab Results:  CBC    Component Value Date/Time   WBC 5.7 06/21/2022 0931   RBC 4.32 06/21/2022 0931   HGB 13.4 06/21/2022 0931   HCT 39.9 06/21/2022 0931   PLT 219 06/21/2022 0931   MCV 92 06/21/2022 0931   MCH 31.0 06/21/2022 0931   MCHC 33.6 06/21/2022 0931   RDW 12.4 06/21/2022 0931   LYMPHSABS 1.4 06/21/2022 0931   EOSABS 0.1 06/21/2022 0931   BASOSABS 0.1 06/21/2022 0931    BMET    Component Value Date/Time   NA 137 04/20/2023 1118   K 5.1 04/20/2023 1118   CL 99 04/20/2023 1118    CO2 23 04/20/2023 1118   GLUCOSE 99 04/20/2023 1118   BUN 15 04/20/2023 1118   CREATININE 0.93 04/20/2023 1118   CALCIUM  9.5 04/20/2023 1118    BNP No results found for: BNP   Imaging:  No results found.  Administration History     None           No data to display          No results found for: NITRICOXIDE      Assessment & Plan:   Mixed sleep apnea Severe mixed sleep apnea, on CPAP. Excellent compliance and control. Receives benefit from use. Aware of proper care/use of device. Understands risks of untreated OSA. Encouraged to continue utilizing nightly. Aware of proper care/use of device. Safe driving practices reviewed. Order placed for new machine.   Patient Instructions  Continue to use CPAP every night, minimum of 4-6 hours a night.  Change equipment every 30 days or as directed by DME. Wash your tubing with warm soap and water daily, hang to dry. Wash humidifier portion weekly. Use bottled, distilled water and change daily  Be aware of reduced alertness and do not drive or operate heavy machinery if experiencing this or drowsiness.  Notify if persistent daytime sleepiness occurs even with consistent use of CPAP.   Continue ambien  10 mg At bedtime as needed for sleep. Take within 30 minutes of intended sleep onset, with plans for 7-8 hours of sleep. If the 5 mg does not help you fall asleep within 30 min-1 hr, you can take an additional 5 mg. Do not drive after taking. Stop and notify if any abnormal sleep habits or mood changes develop    Order placed for new CPAP machine  Make sure to keep a SD card in your old machine if you're using it at the beach and bring this to your follow up appointments   Follow up in 10-12 weeks with Alston Jerry Kerrigan Gombos,NP, or sooner, if needed     Insomnia Stable on current regimen. No aberrant behavior. Doing well with ambien . No side effects. Sleep hygiene reviewed.     I spent 32 minutes of dedicated to the care of this  patient on the date of this encounter to include pre-visit review of records, face-to-face time  with the patient discussing conditions above, post visit ordering of testing, clinical documentation with the electronic health record, making appropriate referrals as documented, and communicating necessary findings to members of the patients care team.  Roetta Clarke, NP 09/22/2023  Pt aware and understands NP's role.

## 2023-09-22 NOTE — Patient Instructions (Addendum)
 Continue to use CPAP every night, minimum of 4-6 hours a night.  Change equipment every 30 days or as directed by DME. Wash your tubing with warm soap and water daily, hang to dry. Wash humidifier portion weekly. Use bottled, distilled water and change daily  Be aware of reduced alertness and do not drive or operate heavy machinery if experiencing this or drowsiness.  Notify if persistent daytime sleepiness occurs even with consistent use of CPAP.   Continue ambien  10 mg At bedtime as needed for sleep. Take within 30 minutes of intended sleep onset, with plans for 7-8 hours of sleep. If the 5 mg does not help you fall asleep within 30 min-1 hr, you can take an additional 5 mg. Do not drive after taking. Stop and notify if any abnormal sleep habits or mood changes develop    Order placed for new CPAP machine  Make sure to keep a SD card in your old machine if you're using it at the beach and bring this to your follow up appointments   Follow up in 10-12 weeks with Lance Jerry Kelee Cunningham,NP, or sooner, if needed

## 2023-09-27 DIAGNOSIS — M19012 Primary osteoarthritis, left shoulder: Secondary | ICD-10-CM | POA: Diagnosis not present

## 2023-09-29 ENCOUNTER — Other Ambulatory Visit (HOSPITAL_COMMUNITY): Payer: Self-pay

## 2023-09-29 ENCOUNTER — Other Ambulatory Visit (HOSPITAL_BASED_OUTPATIENT_CLINIC_OR_DEPARTMENT_OTHER): Payer: Self-pay | Admitting: Family Medicine

## 2023-09-29 DIAGNOSIS — E1169 Type 2 diabetes mellitus with other specified complication: Secondary | ICD-10-CM

## 2023-09-29 MED ORDER — METFORMIN HCL 1000 MG PO TABS
1000.0000 mg | ORAL_TABLET | Freq: Two times a day (BID) | ORAL | 0 refills | Status: DC
  Filled 2023-09-29: qty 180, 90d supply, fill #0

## 2023-09-29 MED ORDER — ATORVASTATIN CALCIUM 40 MG PO TABS
40.0000 mg | ORAL_TABLET | Freq: Every day | ORAL | 0 refills | Status: DC
  Filled 2023-09-29: qty 90, 90d supply, fill #0

## 2023-09-29 MED ORDER — FINASTERIDE 5 MG PO TABS
5.0000 mg | ORAL_TABLET | Freq: Every day | ORAL | 0 refills | Status: DC
  Filled 2023-09-29: qty 90, 90d supply, fill #0

## 2023-10-13 ENCOUNTER — Other Ambulatory Visit (HOSPITAL_COMMUNITY): Payer: Self-pay

## 2023-10-13 ENCOUNTER — Other Ambulatory Visit (HOSPITAL_BASED_OUTPATIENT_CLINIC_OR_DEPARTMENT_OTHER): Payer: Self-pay | Admitting: Family Medicine

## 2023-10-13 DIAGNOSIS — E119 Type 2 diabetes mellitus without complications: Secondary | ICD-10-CM

## 2023-10-13 MED ORDER — MOUNJARO 5 MG/0.5ML ~~LOC~~ SOAJ
5.0000 mg | SUBCUTANEOUS | 1 refills | Status: AC
  Filled 2023-10-29: qty 2, 28d supply, fill #0
  Filled 2023-11-11: qty 6, 84d supply, fill #0
  Filled 2023-12-19 – 2024-03-06 (×4): qty 6, 84d supply, fill #1
  Filled 2024-03-06: qty 6, 84d supply, fill #0

## 2023-10-14 ENCOUNTER — Other Ambulatory Visit: Payer: Self-pay

## 2023-10-18 DIAGNOSIS — G4733 Obstructive sleep apnea (adult) (pediatric): Secondary | ICD-10-CM | POA: Diagnosis not present

## 2023-10-18 DIAGNOSIS — M19012 Primary osteoarthritis, left shoulder: Secondary | ICD-10-CM | POA: Diagnosis not present

## 2023-10-28 ENCOUNTER — Other Ambulatory Visit (HOSPITAL_BASED_OUTPATIENT_CLINIC_OR_DEPARTMENT_OTHER): Payer: Self-pay

## 2023-10-29 ENCOUNTER — Other Ambulatory Visit: Payer: Self-pay | Admitting: Nurse Practitioner

## 2023-10-29 ENCOUNTER — Other Ambulatory Visit (HOSPITAL_COMMUNITY): Payer: Self-pay

## 2023-10-29 DIAGNOSIS — F5104 Psychophysiologic insomnia: Secondary | ICD-10-CM

## 2023-10-31 ENCOUNTER — Other Ambulatory Visit: Payer: Self-pay

## 2023-10-31 ENCOUNTER — Other Ambulatory Visit (HOSPITAL_COMMUNITY): Payer: Self-pay

## 2023-10-31 MED ORDER — TAMSULOSIN HCL 0.4 MG PO CAPS
0.4000 mg | ORAL_CAPSULE | Freq: Two times a day (BID) | ORAL | 1 refills | Status: DC
  Filled 2023-10-31: qty 180, 90d supply, fill #0
  Filled 2024-01-29: qty 180, 90d supply, fill #1

## 2023-10-31 MED ORDER — ZOLPIDEM TARTRATE 5 MG PO TABS
10.0000 mg | ORAL_TABLET | Freq: Every evening | ORAL | 5 refills | Status: DC | PRN
  Filled 2023-10-31: qty 60, 30d supply, fill #0
  Filled 2023-12-19 – 2023-12-20 (×2): qty 60, 30d supply, fill #1
  Filled 2024-02-25 – 2024-03-06 (×2): qty 60, 30d supply, fill #2
  Filled 2024-03-06: qty 60, 30d supply, fill #0

## 2023-10-31 NOTE — Telephone Encounter (Signed)
**Note De-identified  [place] Obfuscation** Please advise 

## 2023-11-01 ENCOUNTER — Other Ambulatory Visit: Payer: Self-pay

## 2023-11-04 ENCOUNTER — Telehealth: Payer: Self-pay

## 2023-11-04 NOTE — Progress Notes (Signed)
   11/04/2023  Patient ID: Lance Davis, male   DOB: July 04, 1955, 68 y.o.   MRN: [id number]  This patient is appearing on a report for being at risk of failing the adherence measure for identified medications this calendar year.   Medication Adherence Summary (STAR/HEDIS Monitoring): Adherence Category: diabetes    Drug Name: Mounjaro  5 mg  Last Fill or Sold Date:10/29/2023 Days' Supply: 28    Notes: ? Adherence data NOT pulled from pharmacy claims portal Dr. Annemarie. ? Reviewed barriers to adherence: side effects. ? Plan: Follow up next month   Dorcas Solian, PharmD Clinical Pharmacist Cell: [phone]

## 2023-11-08 ENCOUNTER — Other Ambulatory Visit (HOSPITAL_COMMUNITY): Payer: Self-pay

## 2023-11-11 ENCOUNTER — Other Ambulatory Visit (HOSPITAL_BASED_OUTPATIENT_CLINIC_OR_DEPARTMENT_OTHER): Payer: Self-pay

## 2023-11-15 ENCOUNTER — Other Ambulatory Visit (HOSPITAL_BASED_OUTPATIENT_CLINIC_OR_DEPARTMENT_OTHER): Payer: Self-pay

## 2023-11-17 DIAGNOSIS — M19012 Primary osteoarthritis, left shoulder: Secondary | ICD-10-CM | POA: Diagnosis not present

## 2023-12-06 ENCOUNTER — Encounter (HOSPITAL_BASED_OUTPATIENT_CLINIC_OR_DEPARTMENT_OTHER): Payer: Medicare HMO

## 2023-12-07 ENCOUNTER — Encounter (HOSPITAL_BASED_OUTPATIENT_CLINIC_OR_DEPARTMENT_OTHER): Payer: Self-pay | Admitting: Family Medicine

## 2023-12-07 ENCOUNTER — Ambulatory Visit (INDEPENDENT_AMBULATORY_CARE_PROVIDER_SITE_OTHER)

## 2023-12-07 ENCOUNTER — Ambulatory Visit (HOSPITAL_BASED_OUTPATIENT_CLINIC_OR_DEPARTMENT_OTHER): Admitting: Family Medicine

## 2023-12-07 VITALS — BP 123/82 | HR 66 | Ht 74.5 in | Wt 239.0 lb

## 2023-12-07 DIAGNOSIS — L719 Rosacea, unspecified: Secondary | ICD-10-CM | POA: Insufficient documentation

## 2023-12-07 DIAGNOSIS — M25512 Pain in left shoulder: Secondary | ICD-10-CM | POA: Diagnosis not present

## 2023-12-07 DIAGNOSIS — Z7985 Long-term (current) use of injectable non-insulin antidiabetic drugs: Secondary | ICD-10-CM

## 2023-12-07 DIAGNOSIS — Z23 Encounter for immunization: Secondary | ICD-10-CM | POA: Diagnosis not present

## 2023-12-07 DIAGNOSIS — Z7984 Long term (current) use of oral hypoglycemic drugs: Secondary | ICD-10-CM | POA: Diagnosis not present

## 2023-12-07 DIAGNOSIS — C44329 Squamous cell carcinoma of skin of other parts of face: Secondary | ICD-10-CM | POA: Insufficient documentation

## 2023-12-07 DIAGNOSIS — G8929 Other chronic pain: Secondary | ICD-10-CM | POA: Diagnosis not present

## 2023-12-07 DIAGNOSIS — K5900 Constipation, unspecified: Secondary | ICD-10-CM | POA: Diagnosis not present

## 2023-12-07 DIAGNOSIS — E119 Type 2 diabetes mellitus without complications: Secondary | ICD-10-CM | POA: Diagnosis not present

## 2023-12-07 DIAGNOSIS — I10 Essential (primary) hypertension: Secondary | ICD-10-CM

## 2023-12-07 DIAGNOSIS — M19012 Primary osteoarthritis, left shoulder: Secondary | ICD-10-CM | POA: Diagnosis not present

## 2023-12-07 LAB — POCT GLYCOSYLATED HEMOGLOBIN (HGB A1C)
HbA1c POC (<> result, manual entry): 5.5 % (ref 4.0–5.6)
HbA1c, POC (controlled diabetic range): 5.5 % (ref 0.0–7.0)
HbA1c, POC (prediabetic range): 5.5 % — AB (ref 5.7–6.4)
Hemoglobin A1C: 5.5 % (ref 4.0–5.6)

## 2023-12-07 NOTE — Assessment & Plan Note (Signed)
 Blood pressure well-controlled today.  Denies any current concerns.  He has had some fluctuating blood pressure in the past, generally will have lower blood pressure readings when he remains more physically active.  During these times, he may not take his blood pressure medication, but does monitor blood pressure closely at home.  If blood pressure is running higher, it has generally been well-controlled with losartan  No concerns today, can continue with current treatment plan.  Recommend intermittent monitoring of blood pressure at home, DASH diet

## 2023-12-07 NOTE — Assessment & Plan Note (Signed)
 Patient continues to have left shoulder pain.  He was able to establish with physical therapy and unfortunately thinks that pain has progressed since starting with physical therapy.  Denies any new injury or change in symptoms. We discussed options, can proceed with x-rays for further assessment this time.  He would also like to proceed with referral to orthopedic surgeon which is reasonable, referral placed today.

## 2023-12-07 NOTE — Progress Notes (Signed)
    Procedures performed today:    None.  Independent interpretation of notes and tests performed by another provider:   None.  Brief History, Exam, Impression, and Recommendations:    BP 123/82 (BP Location: Right Arm, Patient Position: Sitting, Cuff Size: Large)   Pulse 66   Ht 6' 2.5 (1.892 m)   Wt 239 lb (108.4 kg)   SpO2 96%   BMI 30.28 kg/m   Type 2 diabetes mellitus without complication, without long-term current use of insulin (HCC) Assessment & Plan: Generally doing well, continues with Mounjaro  and metformin .  Denies any significant issues with medication. A1c has previously been very well-controlled. We will recheck A1c today No medication changes today.  Foot exam completed today Urine ACR today  Orders: -     Microalbumin / creatinine urine ratio -     POCT glycosylated hemoglobin (Hb A1C)  Encounter for immunization -     Flu vaccine HIGH DOSE PF(Fluzone Trivalent)  Left shoulder pain, unspecified chronicity Assessment & Plan: Patient continues to have left shoulder pain.  He was able to establish with physical therapy and unfortunately thinks that pain has progressed since starting with physical therapy.  Denies any new injury or change in symptoms. We discussed options, can proceed with x-rays for further assessment this time.  He would also like to proceed with referral to orthopedic surgeon which is reasonable, referral placed today.  Orders: -     Ambulatory referral to Orthopedic Surgery -     DG Shoulder Left; Future  Primary hypertension Assessment & Plan: Blood pressure well-controlled today.  Denies any current concerns.  He has had some fluctuating blood pressure in the past, generally will have lower blood pressure readings when he remains more physically active.  During these times, he may not take his blood pressure medication, but does monitor blood pressure closely at home.  If blood pressure is running higher, it has generally been  well-controlled with losartan  No concerns today, can continue with current treatment plan.  Recommend intermittent monitoring of blood pressure at home, DASH diet   Constipation, unspecified constipation type Assessment & Plan: Patient reports relatively recent issue with constipation.  This has been present over the past 3 to 4 days.  He thinks that this is likely related to dehydration.  He denies any issues with blood in the stool, no abdominal pain.  He generally does take Metamucil daily, tries to maintain adequate water intake.  He did try OTC laxative suppository without relief. We discussed general considerations, discussed importance of staying well-hydrated.  Can continue with regular fiber intake, appropriate water intake.  Can also utilize stool softener, discussed MiraLAX given number of days since last bowel movement.   Return in about 4 months (around 04/07/2024) for CPE with fasting labs 1 week prior.   ___________________________________________ Sherida Dobkins de Peru, MD, ABFM, CAQSM Primary Care and Sports Medicine [facility]

## 2023-12-07 NOTE — Assessment & Plan Note (Signed)
 Patient reports relatively recent issue with constipation.  This has been present over the past 3 to 4 days.  He thinks that this is likely related to dehydration.  He denies any issues with blood in the stool, no abdominal pain.  He generally does take Metamucil daily, tries to maintain adequate water intake.  He did try OTC laxative suppository without relief. We discussed general considerations, discussed importance of staying well-hydrated.  Can continue with regular fiber intake, appropriate water intake.  Can also utilize stool softener, discussed MiraLAX given number of days since last bowel movement.

## 2023-12-07 NOTE — Patient Instructions (Signed)
  Medication Instructions:  Your physician recommends that you continue on your current medications as directed. Please refer to the Current Medication list given to you today. --If you need a refill on any your medications before your next appointment, please call your pharmacy first. If no refills are authorized on file call the office.-- Lab Work: Your physician has recommended that you have lab work today: 1 week before next visit  If you have labs (blood work) drawn today and your tests are completely normal, you will receive your results via MyChart message OR a phone call from our staff.  Please ensure you check your voicemail in the event that you authorized detailed messages to be left on a delegated number. If you have any lab test that is abnormal or we need to change your treatment, we will call you to review the results.  Referrals/Procedures/Imaging: Orthopedic   Follow-Up: Your next appointment:   Your physician recommends that you schedule a follow-up appointment in: 4 months physical with Dr. de Peru  You will receive a text message or e-mail with a link to a survey about your care and experience with us  today! We would greatly appreciate your feedback!   Thanks for letting us  be apart of your health journey!!  Primary Care and Sports Medicine   Dr. Quintin sheerer Peru   We encourage you to activate your patient portal called MyChart.  Sign up information is provided on this After Visit Summary.  MyChart is used to connect with patients for Virtual Visits (Telemedicine).  Patients are able to view lab/test results, encounter notes, upcoming appointments, etc.  Non-urgent messages can be sent to your provider as well. To learn more about what you can do with MyChart, please visit --  [email or web address].

## 2023-12-07 NOTE — Assessment & Plan Note (Addendum)
 Generally doing well, continues with Mounjaro  and metformin .  Denies any significant issues with medication. A1c has previously been very well-controlled. We will recheck A1c today No medication changes today.  Foot exam completed today Urine ACR today

## 2023-12-08 DIAGNOSIS — M19012 Primary osteoarthritis, left shoulder: Secondary | ICD-10-CM | POA: Diagnosis not present

## 2023-12-08 LAB — MICROALBUMIN / CREATININE URINE RATIO
Creatinine, Urine: 63.3 mg/dL
Microalb/Creat Ratio: <5 mg/g{creat} (ref 0–29)
Microalbumin, Urine: <3 ug/mL

## 2023-12-10 ENCOUNTER — Encounter (HOSPITAL_BASED_OUTPATIENT_CLINIC_OR_DEPARTMENT_OTHER): Payer: Self-pay | Admitting: Family Medicine

## 2023-12-20 ENCOUNTER — Other Ambulatory Visit (HOSPITAL_COMMUNITY): Payer: Self-pay

## 2023-12-20 ENCOUNTER — Other Ambulatory Visit (HOSPITAL_BASED_OUTPATIENT_CLINIC_OR_DEPARTMENT_OTHER): Payer: Self-pay

## 2023-12-20 ENCOUNTER — Other Ambulatory Visit: Payer: Self-pay

## 2023-12-22 ENCOUNTER — Ambulatory Visit (HOSPITAL_BASED_OUTPATIENT_CLINIC_OR_DEPARTMENT_OTHER): Payer: Self-pay | Admitting: Family Medicine

## 2023-12-23 ENCOUNTER — Telehealth (HOSPITAL_BASED_OUTPATIENT_CLINIC_OR_DEPARTMENT_OTHER): Payer: Self-pay

## 2023-12-23 NOTE — Telephone Encounter (Signed)
 Spoke with pt and he states he has not changed companies he is still using [employer] and he mailed the loaner machine back and called and confirmed that it was received back he only had it because he forgot his machine on his beach trip and rented one during the time he was at the beach. He states he does need a replacement machine as his is old and he notes they have not left any messages for him and he generally does not answer numbers with a 800 at the beginning.  Spoke with Rosaline to notify her and she asks that we send the order and chart notes to [phone]. I have printed and faxed. NFN      Copied from CRM [id number]. Topic: Clinical - Order For Equipment >> Dec 21, 2023  3:55 PM Russell PARAS wrote: Reason for CRM:   Rosaline, with [employer], is contacting clinic to verify if pt has been seen in the past 6 months, due to being eligible for CPAP replacement.   Advised he was seen in 09/2023 and an order for CPAP was sent to [facility]. Advised was unable to verify if pt received machine. Of note Rosaline reports pt was provided a loaner machine in 09/2023, around the time the order was placed.   Rosaline reports they have been unsuccessful in contacting pt to verify if he requires a new machine and that he still has the machine loaned to him by the company.   Requested call back for further clarification  CB#  [phone]

## 2023-12-28 ENCOUNTER — Other Ambulatory Visit (HOSPITAL_COMMUNITY): Payer: Self-pay

## 2023-12-28 ENCOUNTER — Other Ambulatory Visit: Payer: Self-pay

## 2023-12-28 ENCOUNTER — Other Ambulatory Visit (HOSPITAL_BASED_OUTPATIENT_CLINIC_OR_DEPARTMENT_OTHER): Payer: Self-pay | Admitting: Family Medicine

## 2023-12-28 DIAGNOSIS — E1169 Type 2 diabetes mellitus with other specified complication: Secondary | ICD-10-CM

## 2023-12-28 MED ORDER — ATORVASTATIN CALCIUM 40 MG PO TABS
40.0000 mg | ORAL_TABLET | Freq: Every day | ORAL | 0 refills | Status: DC
  Filled 2023-12-28: qty 90, 90d supply, fill #0

## 2023-12-28 MED ORDER — METFORMIN HCL 1000 MG PO TABS
1000.0000 mg | ORAL_TABLET | Freq: Two times a day (BID) | ORAL | 0 refills | Status: DC
  Filled 2023-12-28: qty 180, 90d supply, fill #0

## 2023-12-28 MED ORDER — FINASTERIDE 5 MG PO TABS
5.0000 mg | ORAL_TABLET | Freq: Every day | ORAL | 0 refills | Status: DC
  Filled 2023-12-28: qty 90, 90d supply, fill #0

## 2023-12-29 DIAGNOSIS — M19012 Primary osteoarthritis, left shoulder: Secondary | ICD-10-CM | POA: Diagnosis not present

## 2023-12-30 ENCOUNTER — Ambulatory Visit (HOSPITAL_BASED_OUTPATIENT_CLINIC_OR_DEPARTMENT_OTHER): Admitting: Orthopaedic Surgery

## 2023-12-30 DIAGNOSIS — M25512 Pain in left shoulder: Secondary | ICD-10-CM | POA: Diagnosis not present

## 2023-12-30 DIAGNOSIS — M19012 Primary osteoarthritis, left shoulder: Secondary | ICD-10-CM

## 2023-12-30 MED ORDER — TRIAMCINOLONE ACETONIDE 40 MG/ML IJ SUSP
80.0000 mg | INTRAMUSCULAR | Status: AC | PRN
  Administered 2023-12-30: 80 mg via INTRA_ARTICULAR

## 2023-12-30 MED ORDER — LIDOCAINE HCL 1 % IJ SOLN
4.0000 mL | INTRAMUSCULAR | Status: AC | PRN
  Administered 2023-12-30: 4 mL

## 2023-12-30 NOTE — Progress Notes (Addendum)
 Chief Complaint: Left shoulder pain     History of Present Illness:    Lance Davis is a 68 y.o. male right dominant male presents with ongoing left shoulder pain for the last several years.  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  He played at both [employer] as well as [employer].  He has been doing physical therapy which has been helping with range of motion although he is still having pain and difficulty laying on the side.  He does still enjoy being very active although he is having pain with most overhead activities    PMH/PSH/Family History/Social History/Meds/Allergies:    Past Medical History:  Diagnosis Date  . Allergy    spring time  . Aortic atherosclerosis 11/11/2021  . Arthritis   . Atypical nevus 04/26/2016   mild - right mid back  . Blood transfusion 1963  . Cancer (HCC) 2019   melanoma face  . Clark level II melanoma (HCC) 12/13/2017   left inner cheek  . Palpitations 11/11/2021  . Sleep apnea    cpap  . Squamous cell carcinoma in situ of skin 04/26/2016   inner left cheek   Past Surgical History:  Procedure Laterality Date  . COLONOSCOPY    . I & D EXTREMITY  07/14/2011   Procedure: IRRIGATION AND DEBRIDEMENT EXTREMITY;  Surgeon: Prentice LELON Pagan, MD;  Location: [facility] OR;  Service: Orthopedics;  Laterality: Left;  . POLYPECTOMY    . TENDON REPAIR  07/14/2011   Procedure: TENDON REPAIR;  Surgeon: Prentice LELON Pagan, MD;  Location: [facility] OR;  Service: Orthopedics;  Laterality: Left;   Social History   Socioeconomic History  . Marital status: Married    Spouse name: Not on file  . Number of children: Not on file  . Years of education: Not on file  . Highest education level: Not on file  Occupational History  . Not on file  Tobacco Use  . Smoking status: Never    Passive exposure: Never  . Smokeless tobacco: Never  Vaping Use  . Vaping status: Never Used  Substance and Sexual Activity  . Alcohol use: Yes     Alcohol/week: 14.0 standard drinks of alcohol    Types: 14 Glasses of wine per week  . Drug use: No  . Sexual activity: Yes  Other Topics Concern  . Not on file  Social History Narrative  . Not on file   Social Drivers of Health   Financial Resource Strain: Low Risk  (12/02/2022)   Overall Financial Resource Strain (CARDIA)   . Difficulty of Paying Living Expenses: Not hard at all  Food Insecurity: No Food Insecurity (12/02/2022)   Hunger Vital Sign   . Worried About Programme researcher, broadcasting/film/video in the Last Year: Never true   . Ran Out of Food in the Last Year: Never true  Transportation Needs: No Transportation Needs (12/02/2022)   PRAPARE - Transportation   . Lack of Transportation (Medical): No   . Lack of Transportation (Non-Medical): No  Physical Activity: Sufficiently Active (12/02/2022)   Exercise Vital Sign   . Days of Exercise per Week: 7 days   . Minutes of Exercise per Session: 30 min  Stress: No Stress Concern Present (12/02/2022)   [employer] of Occupational Health - Occupational Stress Questionnaire   . Feeling of Stress : Not at all  Social Connections: Socially Integrated (12/02/2022)   Social Connection and Isolation Panel   .  Frequency of Communication with Friends and Family: More than three times a week   . Frequency of Social Gatherings with Friends and Family: More than three times a week   . Attends Religious Services: More than 4 times per year   . Active Member of Clubs or Organizations: Yes   . Attends Banker Meetings: More than 4 times per year   . Marital Status: Married   Family History  Problem Relation Age of Onset  . Diabetes Mother   . Heart disease Mother        Mom  . CAD Mother   . Frontotemporal dementia Father   . Valvular heart disease Father   . Diabetes Sister   . Epilepsy Sister   . Diabetes Brother   . Heart disease Brother        Suddenly died at 90 from [place] parent heart attackP  . Valvular heart disease Maternal  Uncle   . Colon cancer Neg Hx   . Colon polyps Neg Hx   . Esophageal cancer Neg Hx   . Stomach cancer Neg Hx   . Rectal cancer Neg Hx    No Known Allergies Current Outpatient Medications  Medication Sig Dispense Refill  . atorvastatin  (LIPITOR) 40 MG tablet Take 1 tablet (40 mg total) by mouth daily. 90 tablet 0  . finasteride  (PROSCAR ) 5 MG tablet Take 1 tablet (5 mg total) by mouth daily. 90 tablet 0  . losartan  (COZAAR ) 50 MG tablet Take 1 tablet (50 mg total) by mouth daily. 90 tablet 3  . metFORMIN  (GLUCOPHAGE ) 1000 MG tablet Take 1 tablet (1,000 mg total) by mouth 2 (two) times daily with a meal. 180 tablet 0  . tamsulosin  (FLOMAX ) 0.4 MG CAPS capsule Take 1 capsule (0.4 mg total) by mouth 2 (two) times daily. 180 capsule 1  . tirzepatide  (MOUNJARO ) 5 MG/0.5ML Pen Inject 5 mg into the skin once a week. 6 mL 1  . zolpidem  (AMBIEN ) 5 MG tablet Take 2 tablets (10 mg total) by mouth at bedtime as needed for sleep. 60 tablet 5   No current facility-administered medications for this visit.   No results found.  Review of Systems:   A ROS was performed including pertinent positives and negatives as documented in the HPI.  Physical Exam :   Constitutional: NAD and appears stated age Neurological: Alert and oriented Psych: Appropriate affect and cooperative There were no vitals taken for this visit.   Comprehensive Musculoskeletal Exam:    Left shoulder with active forward elevation to 160 external rotation at the side is 15 degrees, internal rotation is to L3 bilaterally there is pain and crepitus about the left shoulder throughout   Imaging:   Xray (3 views left shoulder): Advanced osteoarthritis about the glenohumeral joint    I personally reviewed and interpreted the radiographs.   Assessment and Plan:   68 y.o. male with left shoulder advanced glenohumeral osteoarthritis.  This time he has tried physical therapy which is helping with range of motion but not his pain.   Given this we did discuss treatment options.  Overall I do believe he would be a candidate for left reverse shoulder arthroplasty.  He is electing for an injection at today's visit which I did provide after verbal consent was obtained.  He will send us  a message should he want to further consider shoulder arthroplasty  -Left shoulder injection provided after verbal consent obtained    Procedure Note  Patient: Lance Davis  Date of Birth: [birth year]           MRN: [id number]             Visit Date: 12/30/2023  Procedures: Visit Diagnoses:  1. Primary osteoarthritis, left shoulder     Large Joint Inj: L glenohumeral on 12/30/2023 12:37 PM Indications: pain Details: 22 G 1.5 in needle, ultrasound-guided anterior approach  Arthrogram: No  Medications: 4 mL lidocaine  1 %; 80 mg triamcinolone  acetonide 40 MG/ML Outcome: tolerated well, no immediate complications Procedure, treatment alternatives, risks and benefits explained, specific risks discussed. Consent was given by the patient. Immediately prior to procedure a time out was called to verify the correct patient, procedure, equipment, support staff and site/side marked as required. Patient was prepped and draped in the usual sterile fashion.        I personally saw and evaluated the patient, and participated in the management and treatment plan.  Elspeth Parker, MD Attending Physician, Orthopedic Surgery  This document was dictated using Dragon voice recognition software. A reasonable attempt at proof reading has been made to minimize errors.

## 2024-01-03 ENCOUNTER — Encounter (HOSPITAL_BASED_OUTPATIENT_CLINIC_OR_DEPARTMENT_OTHER)

## 2024-01-04 ENCOUNTER — Other Ambulatory Visit (HOSPITAL_BASED_OUTPATIENT_CLINIC_OR_DEPARTMENT_OTHER): Payer: Self-pay

## 2024-01-04 MED ORDER — COMIRNATY 30 MCG/0.3ML IM SUSY
0.3000 mL | PREFILLED_SYRINGE | Freq: Once | INTRAMUSCULAR | 0 refills | Status: AC
Start: 2024-01-04 — End: 2024-01-05
  Filled 2024-01-04: qty 0.3, 1d supply, fill #0

## 2024-01-11 DIAGNOSIS — G4733 Obstructive sleep apnea (adult) (pediatric): Secondary | ICD-10-CM | POA: Diagnosis not present

## 2024-01-12 ENCOUNTER — Other Ambulatory Visit (HOSPITAL_COMMUNITY): Payer: Self-pay

## 2024-01-12 DIAGNOSIS — Z411 Encounter for cosmetic surgery: Secondary | ICD-10-CM | POA: Diagnosis not present

## 2024-01-12 DIAGNOSIS — D489 Neoplasm of uncertain behavior, unspecified: Secondary | ICD-10-CM | POA: Diagnosis not present

## 2024-01-12 MED ORDER — TRETINOIN 0.025 % EX CREA
TOPICAL_CREAM | CUTANEOUS | 5 refills | Status: AC
  Filled 2024-01-12 – 2024-04-16 (×8): qty 45, 30d supply, fill #0

## 2024-01-13 ENCOUNTER — Other Ambulatory Visit (HOSPITAL_COMMUNITY): Payer: Self-pay

## 2024-01-13 ENCOUNTER — Other Ambulatory Visit: Payer: Self-pay

## 2024-01-14 ENCOUNTER — Other Ambulatory Visit (HOSPITAL_COMMUNITY): Payer: Self-pay

## 2024-01-15 ENCOUNTER — Other Ambulatory Visit (HOSPITAL_COMMUNITY): Payer: Self-pay

## 2024-01-16 ENCOUNTER — Other Ambulatory Visit: Payer: Self-pay

## 2024-01-16 ENCOUNTER — Other Ambulatory Visit (HOSPITAL_COMMUNITY): Payer: Self-pay

## 2024-01-17 ENCOUNTER — Other Ambulatory Visit (HOSPITAL_COMMUNITY): Payer: Self-pay

## 2024-01-18 DIAGNOSIS — D485 Neoplasm of uncertain behavior of skin: Secondary | ICD-10-CM | POA: Diagnosis not present

## 2024-01-18 DIAGNOSIS — D044 Carcinoma in situ of skin of scalp and neck: Secondary | ICD-10-CM | POA: Diagnosis not present

## 2024-01-18 DIAGNOSIS — D0439 Carcinoma in situ of skin of other parts of face: Secondary | ICD-10-CM | POA: Diagnosis not present

## 2024-01-19 ENCOUNTER — Other Ambulatory Visit (HOSPITAL_BASED_OUTPATIENT_CLINIC_OR_DEPARTMENT_OTHER): Payer: Self-pay

## 2024-01-19 MED ORDER — FLUOROURACIL 5 % EX CREA
TOPICAL_CREAM | Freq: Every day | CUTANEOUS | 0 refills | Status: AC
  Filled 2024-01-19: qty 40, 30d supply, fill #0

## 2024-01-25 DIAGNOSIS — M19012 Primary osteoarthritis, left shoulder: Secondary | ICD-10-CM | POA: Diagnosis not present

## 2024-02-06 ENCOUNTER — Encounter: Payer: Self-pay | Admitting: Radiology

## 2024-02-13 DIAGNOSIS — M19012 Primary osteoarthritis, left shoulder: Secondary | ICD-10-CM | POA: Diagnosis not present

## 2024-02-20 DIAGNOSIS — Z85820 Personal history of malignant melanoma of skin: Secondary | ICD-10-CM | POA: Diagnosis not present

## 2024-02-20 DIAGNOSIS — L814 Other melanin hyperpigmentation: Secondary | ICD-10-CM | POA: Diagnosis not present

## 2024-02-20 DIAGNOSIS — D3617 Benign neoplasm of peripheral nerves and autonomic nervous system of trunk, unspecified: Secondary | ICD-10-CM | POA: Diagnosis not present

## 2024-02-20 DIAGNOSIS — D485 Neoplasm of uncertain behavior of skin: Secondary | ICD-10-CM | POA: Diagnosis not present

## 2024-02-20 DIAGNOSIS — Z85828 Personal history of other malignant neoplasm of skin: Secondary | ICD-10-CM | POA: Diagnosis not present

## 2024-02-20 DIAGNOSIS — L821 Other seborrheic keratosis: Secondary | ICD-10-CM | POA: Diagnosis not present

## 2024-02-20 DIAGNOSIS — L570 Actinic keratosis: Secondary | ICD-10-CM | POA: Diagnosis not present

## 2024-02-20 DIAGNOSIS — L820 Inflamed seborrheic keratosis: Secondary | ICD-10-CM | POA: Diagnosis not present

## 2024-02-20 DIAGNOSIS — L578 Other skin changes due to chronic exposure to nonionizing radiation: Secondary | ICD-10-CM | POA: Diagnosis not present

## 2024-02-20 DIAGNOSIS — D229 Melanocytic nevi, unspecified: Secondary | ICD-10-CM | POA: Diagnosis not present

## 2024-02-20 DIAGNOSIS — D1801 Hemangioma of skin and subcutaneous tissue: Secondary | ICD-10-CM | POA: Diagnosis not present

## 2024-02-25 ENCOUNTER — Other Ambulatory Visit (HOSPITAL_COMMUNITY): Payer: Self-pay

## 2024-02-25 ENCOUNTER — Other Ambulatory Visit (HOSPITAL_BASED_OUTPATIENT_CLINIC_OR_DEPARTMENT_OTHER): Payer: Self-pay

## 2024-03-05 DIAGNOSIS — M25512 Pain in left shoulder: Secondary | ICD-10-CM | POA: Diagnosis not present

## 2024-03-06 ENCOUNTER — Other Ambulatory Visit (HOSPITAL_BASED_OUTPATIENT_CLINIC_OR_DEPARTMENT_OTHER): Payer: Self-pay

## 2024-03-06 ENCOUNTER — Other Ambulatory Visit: Payer: Self-pay

## 2024-03-06 ENCOUNTER — Other Ambulatory Visit (HOSPITAL_COMMUNITY): Payer: Self-pay

## 2024-03-07 ENCOUNTER — Other Ambulatory Visit (HOSPITAL_BASED_OUTPATIENT_CLINIC_OR_DEPARTMENT_OTHER): Payer: Self-pay

## 2024-03-07 ENCOUNTER — Other Ambulatory Visit (HOSPITAL_COMMUNITY): Payer: Self-pay

## 2024-03-07 ENCOUNTER — Other Ambulatory Visit: Payer: Self-pay

## 2024-03-07 ENCOUNTER — Ambulatory Visit: Admitting: Pulmonary Disease

## 2024-03-07 ENCOUNTER — Encounter: Payer: Self-pay | Admitting: Pulmonary Disease

## 2024-03-07 VITALS — BP 130/74 | HR 65 | Temp 98.0°F | Ht 74.5 in | Wt 242.0 lb

## 2024-03-07 DIAGNOSIS — G4700 Insomnia, unspecified: Secondary | ICD-10-CM | POA: Diagnosis not present

## 2024-03-07 DIAGNOSIS — G4739 Other sleep apnea: Secondary | ICD-10-CM

## 2024-03-07 DIAGNOSIS — F5104 Psychophysiologic insomnia: Secondary | ICD-10-CM

## 2024-03-07 DIAGNOSIS — G4730 Sleep apnea, unspecified: Secondary | ICD-10-CM

## 2024-03-07 MED ORDER — ZOLPIDEM TARTRATE 5 MG PO TABS
10.0000 mg | ORAL_TABLET | Freq: Every evening | ORAL | 5 refills | Status: DC | PRN
  Filled 2024-03-09: qty 60, 30d supply, fill #0

## 2024-03-07 NOTE — Progress Notes (Signed)
 Established Patient Pulmonology Office Visit   Subjective:  Patient ID: Lance Davis, male    DOB: 1955/11/12  MRN: [id number]  CC:  Chief Complaint  Patient presents with   Sleep Apnea    Currently using CPAP.  Transferring care from [facility], NP.  Would like to discuss getting a new CPAP.    HPI 68 y.o. male with history of hypertension, chronic insomnia, T2DM, mixed obstructive and central sleep apnea (AHI 35.6, high degree of sleep fragmentation) on CPAP. Takes zolpidem  5 mg at bedtime about 2-3x/week. On other nights he may take a THC gummy to help him sleep, though he feels this is not quite as effective and he wakes up a bit more during the night. When asked whether he wishes to consider taking the zolpidem  more regularly (as replacement for the [facility] gummy) he voices some concern about developing dependence on zolpidem  and ultimately voiced preference to continue current approach.  He reports he may be due for a new CPAP device, with last device given to him >5 years ago while on charles schwab (now on Medicare).  He uses a nasal pillows mask without chinstrap with good effect and acceptable leak.  Sleep habits: - Bedtime: 11 - 11:30 pm - Wake time: 6:30 - 7:30 am - Sleep latency: 20-120 mins - Number of awakenings per night: 2-4 (more when taking THC gummy than when taking Ambien ) - Sleep aids: Zolpidem  5 mg at bedtime (rarely 10 mg at bedtime), and on separate nights to Ambien  only he may take a THC gummy. - Stimulant use: None  Comorbidities: - Hypertension: Yes - Diabetes: Yes - Obesity: Yes  Other: - GLP-1 use: Yes (tirzepatide  5 mg [place])  CPAP Data: - Usage days >\= 4h: 77% - Median usage (days used): 6 hrs 15 mins - Pressure: Median 6.1, 95% 9.3 - Leak: Median 4.8, 95% 20.2 - Device-estimated AHI: 0.6   Pulm Questionnaires:     03/07/2024    9:00 AM  Results of the Epworth flowsheet  Sitting and reading 1  Watching TV 1  Sitting, inactive in a  public place (e.g. a theatre or a meeting) 0  As a passenger in a car for an hour without a break 0  Lying down to rest in the afternoon when circumstances permit 1  Sitting and talking to someone 0  Sitting quietly after a lunch without alcohol 0  In a car, while stopped for a few minutes in traffic 0  Total score 3     ROS   Current Outpatient Medications:    atorvastatin  (LIPITOR) 40 MG tablet, Take 1 tablet (40 mg total) by mouth daily., Disp: 90 tablet, Rfl: 0   finasteride  (PROSCAR ) 5 MG tablet, Take 1 tablet (5 mg total) by mouth daily., Disp: 90 tablet, Rfl: 0   fluorouracil  (EFUDEX ) 5 % cream, Apply topically daily for 6 weeks, Disp: 40 g, Rfl: 0   losartan  (COZAAR ) 50 MG tablet, Take 1 tablet (50 mg total) by mouth daily., Disp: 90 tablet, Rfl: 3   metFORMIN  (GLUCOPHAGE ) 1000 MG tablet, Take 1 tablet (1,000 mg total) by mouth 2 (two) times daily with a meal., Disp: 180 tablet, Rfl: 0   tamsulosin  (FLOMAX ) 0.4 MG CAPS capsule, Take 1 capsule (0.4 mg total) by mouth 2 (two) times daily., Disp: 180 capsule, Rfl: 1   tirzepatide  (MOUNJARO ) 5 MG/0.5ML Pen, Inject 5 mg into the skin once a week., Disp: 6 mL, Rfl: 1   tretinoin  (RETIN-A )  0.025 % cream, Apply a pea sized in the evening to face., Disp: 45 g, Rfl: 5   zolpidem  (AMBIEN ) 5 MG tablet, Take 2 tablets (10 mg total) by mouth at bedtime as needed for sleep., Disp: 60 tablet, Rfl: 5      Objective:  BP 130/74   Pulse 65   Temp 98 F (36.7 C) (Oral)   Ht 6' 2.5 (1.892 m)   Wt 242 lb (109.8 kg)   SpO2 99% Comment: room air  BMI 30.66 kg/m    Physical Exam Constitutional:      Appearance: Normal appearance.     Comments: Overweight  HENT:     Head: Normocephalic and atraumatic.     Mouth/Throat:     Mouth: Mucous membranes are moist.     Pharynx: Oropharynx is clear.  Eyes:     General: No scleral icterus.    Conjunctiva/sclera: Conjunctivae normal.  Pulmonary:     Effort: Pulmonary effort is normal.   Neurological:     Mental Status: He is alert and oriented to person, place, and time. Mental status is at baseline.  Psychiatric:        Mood and Affect: Mood normal.        Thought Content: Thought content normal.        Judgment: Judgment normal.     Diagnostic Review:  RAR:Ojdu metabolic panel Lab Results  Component Value Date   WBC 5.7 06/21/2022   HGB 13.4 06/21/2022   HCT 39.9 06/21/2022   MCV 92 06/21/2022   MCH 31.0 06/21/2022   RDW 12.4 06/21/2022   PLT 219 06/21/2022   Metabolic Panel : Last metabolic panel Lab Results  Component Value Date   GLUCOSE 99 04/20/2023   NA 137 04/20/2023   K 5.1 04/20/2023   CL 99 04/20/2023   CO2 23 04/20/2023   BUN 15 04/20/2023   CREATININE 0.93 04/20/2023   EGFR 89 04/20/2023   CALCIUM  9.5 04/20/2023   PROT 6.3 06/21/2022   ALBUMIN 4.3 06/21/2022   LABGLOB 2.0 06/21/2022   AGRATIO 2.2 06/21/2022   BILITOT 0.8 06/21/2022   ALKPHOS 87 06/21/2022   AST 26 06/21/2022   ALT 24 06/21/2022       Assessment & Plan:   Assessment & Plan Mixed sleep apnea DME: [facility] Device: CPAP 5.4-11.6 cm H2O. Mask: Nasal pillows mask. No chin strap.  Will send order for new/replacement CPAP device to [facility] as his current device is >25 years old.  Patient is adherent to and continuing to benefit from ongoing use of CPAP in terms of sleep continuity, and feeling more refreshed upon waking.    Chronic insomnia Discussed different options including more regular use of zolpidem  to reduce use of THC gummies, as well as trying an alternative sedative-hypnotic medication that might have less grogginess the next morning, however patient ultimately preferred to maintain current treatment at this time. He is aware we can always revisit in the future.  Renew zolpidem  5-10 mg at bedtime PRN insomnia. PDMP reviewed.  Orders:   zolpidem  (AMBIEN ) 5 MG tablet; Take 2 tablets (10 mg total) by mouth at bedtime as needed for sleep.   MDM  Level: Moderate   Return in about 1 year (around 03/07/2025) for Follow up: CPAP.   Lamar JINNY Dales, MD

## 2024-03-07 NOTE — Assessment & Plan Note (Addendum)
 Discussed different options including more regular use of zolpidem  to reduce use of THC gummies, as well as trying an alternative sedative-hypnotic medication that might have less grogginess the next morning, however patient ultimately preferred to maintain current treatment at this time. He is aware we can always revisit in the future.  Renew zolpidem  5-10 mg at bedtime PRN insomnia. PDMP reviewed.  Orders:   zolpidem  (AMBIEN ) 5 MG tablet; Take 2 tablets (10 mg total) by mouth at bedtime as needed for sleep.

## 2024-03-07 NOTE — Assessment & Plan Note (Addendum)
 DME: [facility] Device: CPAP 5.4-11.6 cm H2O. Mask: Nasal pillows mask. No chin strap.  Will send order for new/replacement CPAP device to [facility] as his current device is >69 years old.  Patient is adherent to and continuing to benefit from ongoing use of CPAP in terms of sleep continuity, and feeling more refreshed upon waking.

## 2024-03-09 ENCOUNTER — Other Ambulatory Visit (HOSPITAL_BASED_OUTPATIENT_CLINIC_OR_DEPARTMENT_OTHER): Payer: Self-pay

## 2024-03-10 DIAGNOSIS — M19012 Primary osteoarthritis, left shoulder: Secondary | ICD-10-CM | POA: Diagnosis not present

## 2024-03-12 DIAGNOSIS — M19012 Primary osteoarthritis, left shoulder: Secondary | ICD-10-CM | POA: Diagnosis not present

## 2024-03-15 ENCOUNTER — Telehealth: Payer: Self-pay

## 2024-03-15 ENCOUNTER — Telehealth (HOSPITAL_BASED_OUTPATIENT_CLINIC_OR_DEPARTMENT_OTHER): Payer: Self-pay

## 2024-03-15 NOTE — Telephone Encounter (Signed)
°  Patient Consent for Virtual Visit        Lance Davis has provided verbal consent on 03/15/2024 for a virtual visit (video or telephone).   CONSENT FOR VIRTUAL VISIT FOR:  Lance Davis  By participating in this virtual visit I agree to the following:  I hereby voluntarily request, consent and authorize [facility] and its employed or contracted physicians, physician assistants, nurse practitioners or other licensed health care professionals (the Practitioner), to provide me with telemedicine health care services (the Services) as deemed necessary by the treating Practitioner. I acknowledge and consent to receive the Services by the Practitioner via telemedicine. I understand that the telemedicine visit will involve communicating with the Practitioner through live audiovisual communication technology and the disclosure of certain medical information by electronic transmission. I acknowledge that I have been given the opportunity to request an in-person assessment or other available alternative prior to the telemedicine visit and am voluntarily participating in the telemedicine visit.  I understand that I have the right to withhold or withdraw my consent to the use of telemedicine in the course of my care at any time, without affecting my right to future care or treatment, and that the Practitioner or I may terminate the telemedicine visit at any time. I understand that I have the right to inspect all information obtained and/or recorded in the course of the telemedicine visit and may receive copies of available information for a reasonable fee.  I understand that some of the potential risks of receiving the Services via telemedicine include:  Delay or interruption in medical evaluation due to technological equipment failure or disruption; Information transmitted may not be sufficient (e.g. poor resolution of images) to allow for appropriate medical decision making by the  Practitioner; and/or  In rare instances, security protocols could fail, causing a breach of personal health information.  Furthermore, I acknowledge that it is my responsibility to provide information about my medical history, conditions and care that is complete and accurate to the best of my ability. I acknowledge that Practitioner's advice, recommendations, and/or decision may be based on factors not within their control, such as incomplete or inaccurate data provided by me or distortions of diagnostic images or specimens that may result from electronic transmissions. I understand that the practice of medicine is not an exact science and that Practitioner makes no warranties or guarantees regarding treatment outcomes. I acknowledge that a copy of this consent can be made available to me via my patient portal [facility] MyChart), or I can request a printed copy by calling the office of [facility].    I understand that my insurance will be billed for this visit.   I have read or had this consent read to me. I understand the contents of this consent, which adequately explains the benefits and risks of the Services being provided via telemedicine.  I have been provided ample opportunity to ask questions regarding this consent and the Services and have had my questions answered to my satisfaction. I give my informed consent for the services to be provided through the use of telemedicine in my medical care

## 2024-03-15 NOTE — Telephone Encounter (Signed)
° °  Pre-operative Risk Assessment    Patient Name: Lance Davis  DOB: [birth year] MRN: [id number]   Date of last office visit: 06/30/23 with Dr. Raford Date of next office visit: NA  Request for Surgical Clearance    Procedure:  Lt Reverse Shoulder Arthroplasty  Date of Surgery:  Clearance TBD                                 Surgeon:  Dr. Melita Surgeon's Group or Practice Name:  [facility] Phone number:  [phone] Fax number:  [phone]   Type of Clearance Requested:   - Medical    Type of Anesthesia:  General    Additional requests/questions:    SignedAugustin JONETTA Daring   03/15/2024, 9:31 AM

## 2024-03-15 NOTE — Telephone Encounter (Signed)
° °  Name: Lance Davis  DOB: [birth year]  MRN: [id number]  Primary Cardiologist: Annabella Scarce, MD   Preoperative team, please contact this patient and set up a phone call appointment for further preoperative risk assessment. Please obtain consent and complete medication review. Thank you for your help.  I confirm that guidance regarding antiplatelet and oral anticoagulation therapy has been completed and, if necessary, noted below.  None requested  I also confirmed the patient resides in the state of [place] . As per [facility] Medical Board telemedicine laws, the patient must reside in the state in which the provider is licensed.   Lum LITTIE Louis, NP 03/15/2024, 9:50 AM [facility]

## 2024-03-15 NOTE — Telephone Encounter (Signed)
 Pt scheduled for VV on 12/15

## 2024-03-19 ENCOUNTER — Ambulatory Visit

## 2024-03-19 ENCOUNTER — Other Ambulatory Visit (HOSPITAL_BASED_OUTPATIENT_CLINIC_OR_DEPARTMENT_OTHER): Payer: Self-pay

## 2024-03-19 DIAGNOSIS — Z01810 Encounter for preprocedural cardiovascular examination: Secondary | ICD-10-CM

## 2024-03-19 NOTE — Progress Notes (Signed)
**Note Lance-Identified via Obfuscation**  Virtual Visit via Telephone Note   Because of Lance Davis co-morbid illnesses, he is at least at moderate risk for complications without adequate follow up.  This format is felt to be most appropriate for this patient at this time.  Due to technical limitations with video connection (technology), today's appointment will be conducted as an audio only telehealth visit, and Lance Davis verbally agreed to proceed in this manner.   All issues noted in this document were discussed and addressed.  No physical exam could be performed with this format.  Evaluation Performed:  Preoperative cardiovascular risk assessment _____________   Date:  03/19/2024   Patient ID:  Lance Davis, DOB January 03, 1956, MRN [id number] Patient Location:  Home Provider location:   Office  Primary Care Provider:  de [place], Lance PARAS, MD Primary Cardiologist:  Lance Scarce, MD  Chief Complaint / Patient Profile   68 y.o. y/o male with a h/o coronary calcifications, hypertension, diabetes, OSA, mild dilated ascending aorta who is pending left reverse shoulder arthroplasty and presents today for telephonic preoperative cardiovascular risk assessment.  History of Present Illness    Lance Davis is a 68 y.o. male who presents via audio/video conferencing for a telehealth visit today.  Pt was last seen in cardiology clinic on 06/30/2023 by Dr. Scarce.  At that time Lance Davis was doing well without any symptoms of swelling, excessive fluid intake, or medication use that could contribute to previous hyponatremia.  No chest pain, shortness of breath, or swelling.  The patient is now pending procedure as outlined above. Since his last visit, he has been great without any issues with his heart.  No chest pains, no shortness of breath, no swelling in his feet. Overhead movements or reaching behind him. He walks 2 miles a day and meets the minimum requirements for clearance  No medications indicated as  needing held.  Past Medical History    Past Medical History:  Diagnosis Date   Allergy    spring time   Aortic atherosclerosis 11/11/2021   Arthritis    Atypical nevus 04/26/2016   mild - right mid back   Blood transfusion 1963   Cancer (HCC) 2019   melanoma face   Clark level II melanoma (HCC) 12/13/2017   left inner cheek   Palpitations 11/11/2021   Sleep apnea    cpap   Squamous cell carcinoma in situ of skin 04/26/2016   inner left cheek   Past Surgical History:  Procedure Laterality Date   COLONOSCOPY     I & D EXTREMITY  07/14/2011   Procedure: IRRIGATION AND DEBRIDEMENT EXTREMITY;  Surgeon: Lance LELON Pagan, MD;  Location: [facility] OR;  Service: Orthopedics;  Laterality: Left;   POLYPECTOMY     TENDON REPAIR  07/14/2011   Procedure: TENDON REPAIR;  Surgeon: Lance LELON Pagan, MD;  Location: [facility] OR;  Service: Orthopedics;  Laterality: Left;    Allergies  Allergies[1]  Home Medications    Prior to Admission medications  Medication Sig Start Date End Date Taking? Authorizing Provider  atorvastatin  (LIPITOR) 40 MG tablet Take 1 tablet (40 mg total) by mouth daily. 12/28/23   Lance [place], Lance J, MD  finasteride  (PROSCAR ) 5 MG tablet Take 1 tablet (5 mg total) by mouth daily. 12/28/23     fluorouracil  (EFUDEX ) 5 % cream Apply topically daily for 6 weeks 01/19/24     losartan  (COZAAR ) 50 MG tablet Take 1 tablet (50 mg total) by mouth daily. 02/14/23 04/03/24  Vannie Reche RAMAN, NP  metFORMIN  (GLUCOPHAGE ) 1000 MG tablet Take 1 tablet (1,000 mg total) by mouth 2 (two) times daily with a meal. 12/28/23   Lance [place], Lance PARAS, MD  tamsulosin  (FLOMAX ) 0.4 MG CAPS capsule Take 1 capsule (0.4 mg total) by mouth 2 (two) times daily. 10/31/23     tirzepatide  (MOUNJARO ) 5 MG/0.5ML Pen Inject 5 mg into the skin once a week. 10/13/23   Lance [place], Lance J, MD  tretinoin  (RETIN-A ) 0.025 % cream Apply a pea sized amount in the evening to face. 01/12/24     zolpidem  (AMBIEN ) 5 MG tablet Take 2 tablets (10 mg  total) by mouth at bedtime as needed for sleep. 03/07/24   Stretch, Lance PARAS, MD    Physical Exam    Vital Signs:  Lance Davis does not have vital signs available for review today.  Given telephonic nature of communication, physical exam is limited. AAOx3. NAD. Normal affect.  Speech and respirations are unlabored.  Accessory Clinical Findings    None  Assessment & Plan    1.  Preoperative Cardiovascular Risk Assessment:  } [name] perioperative risk of a major cardiac event is 0.4% according to the Revised Cardiac Risk Index (RCRI).  Therefore, he is at low risk for perioperative complications.   His functional capacity is good at 6.36 METs according to the Duke Activity Status Index (DASI). Recommendations: According to ACC/AHA guidelines, no further cardiovascular testing needed.  The patient may proceed to surgery at acceptable risk.     The patient was advised that if he develops new symptoms prior to surgery to contact our office to arrange for a follow-up visit, and he verbalized understanding.   A copy of this note will be routed to requesting surgeon.  Time:   Today, I have spent 9 minutes with the patient with telehealth technology discussing medical history, symptoms, and management plan.     Lance Lance Fabry, PA-C  03/19/2024, 7:48 AM      [1] No Known Allergies

## 2024-03-20 ENCOUNTER — Other Ambulatory Visit (HOSPITAL_COMMUNITY): Payer: Self-pay

## 2024-03-27 ENCOUNTER — Other Ambulatory Visit: Payer: Self-pay

## 2024-03-27 ENCOUNTER — Other Ambulatory Visit (HOSPITAL_COMMUNITY): Payer: Self-pay

## 2024-03-27 ENCOUNTER — Other Ambulatory Visit (HOSPITAL_BASED_OUTPATIENT_CLINIC_OR_DEPARTMENT_OTHER): Payer: Self-pay | Admitting: Family

## 2024-03-27 DIAGNOSIS — E1169 Type 2 diabetes mellitus with other specified complication: Secondary | ICD-10-CM

## 2024-03-27 MED ORDER — METFORMIN HCL 1000 MG PO TABS
1000.0000 mg | ORAL_TABLET | Freq: Two times a day (BID) | ORAL | 0 refills | Status: DC
  Filled 2024-03-27: qty 180, 90d supply, fill #0

## 2024-03-27 MED ORDER — ATORVASTATIN CALCIUM 40 MG PO TABS
40.0000 mg | ORAL_TABLET | Freq: Every day | ORAL | 0 refills | Status: DC
  Filled 2024-03-27: qty 90, 90d supply, fill #0

## 2024-03-27 MED ORDER — FINASTERIDE 5 MG PO TABS
5.0000 mg | ORAL_TABLET | Freq: Every day | ORAL | 1 refills | Status: DC
  Filled 2024-03-27 – 2024-04-16 (×2): qty 90, 90d supply, fill #0

## 2024-03-27 MED ORDER — LOSARTAN POTASSIUM 50 MG PO TABS
50.0000 mg | ORAL_TABLET | Freq: Every day | ORAL | 3 refills | Status: DC
  Filled 2024-03-27 – 2024-04-16 (×2): qty 90, 90d supply, fill #0

## 2024-03-28 ENCOUNTER — Other Ambulatory Visit: Payer: Self-pay

## 2024-04-04 ENCOUNTER — Other Ambulatory Visit (HOSPITAL_BASED_OUTPATIENT_CLINIC_OR_DEPARTMENT_OTHER): Payer: Self-pay

## 2024-04-04 ENCOUNTER — Other Ambulatory Visit: Payer: Self-pay | Admitting: Pulmonary Disease

## 2024-04-04 ENCOUNTER — Encounter: Payer: Self-pay | Admitting: Pulmonary Disease

## 2024-04-04 ENCOUNTER — Other Ambulatory Visit: Payer: Self-pay

## 2024-04-04 DIAGNOSIS — F5104 Psychophysiologic insomnia: Secondary | ICD-10-CM

## 2024-04-04 MED ORDER — ZOLPIDEM TARTRATE 10 MG PO TABS
10.0000 mg | ORAL_TABLET | Freq: Every evening | ORAL | 5 refills | Status: DC | PRN
  Filled 2024-04-04: qty 30, 30d supply, fill #0

## 2024-04-06 ENCOUNTER — Other Ambulatory Visit (HOSPITAL_BASED_OUTPATIENT_CLINIC_OR_DEPARTMENT_OTHER): Payer: Self-pay | Admitting: Family Medicine

## 2024-04-06 ENCOUNTER — Other Ambulatory Visit (HOSPITAL_BASED_OUTPATIENT_CLINIC_OR_DEPARTMENT_OTHER)

## 2024-04-06 DIAGNOSIS — E871 Hypo-osmolality and hyponatremia: Secondary | ICD-10-CM

## 2024-04-06 LAB — BASIC METABOLIC PANEL WITH GFR
BUN/Creatinine Ratio: 20 (ref 10–24)
BUN: 20 mg/dL (ref 8–27)
CO2: 22 mmol/L (ref 20–29)
Calcium: 9.5 mg/dL (ref 8.6–10.2)
Chloride: 104 mmol/L (ref 96–106)
Creatinine, Ser: 1 mg/dL (ref 0.76–1.27)
Glucose: 102 mg/dL — ABNORMAL HIGH (ref 70–99)
Potassium: 4.7 mmol/L (ref 3.5–5.2)
Sodium: 140 mmol/L (ref 134–144)
eGFR: 81 mL/min/1.73

## 2024-04-08 ENCOUNTER — Other Ambulatory Visit (HOSPITAL_COMMUNITY): Payer: Self-pay

## 2024-04-09 DIAGNOSIS — Z0000 Encounter for general adult medical examination without abnormal findings: Secondary | ICD-10-CM

## 2024-04-11 ENCOUNTER — Encounter (HOSPITAL_BASED_OUTPATIENT_CLINIC_OR_DEPARTMENT_OTHER): Admitting: Family Medicine

## 2024-04-11 ENCOUNTER — Encounter (HOSPITAL_BASED_OUTPATIENT_CLINIC_OR_DEPARTMENT_OTHER): Payer: Self-pay | Admitting: Family Medicine

## 2024-04-11 ENCOUNTER — Encounter (HOSPITAL_BASED_OUTPATIENT_CLINIC_OR_DEPARTMENT_OTHER): Payer: Self-pay

## 2024-04-11 ENCOUNTER — Ambulatory Visit (INDEPENDENT_AMBULATORY_CARE_PROVIDER_SITE_OTHER): Admitting: Family Medicine

## 2024-04-11 VITALS — BP 132/78 | HR 64 | Ht 74.5 in | Wt 240.0 lb

## 2024-04-11 DIAGNOSIS — Z01818 Encounter for other preprocedural examination: Secondary | ICD-10-CM

## 2024-04-11 DIAGNOSIS — Z0000 Encounter for general adult medical examination without abnormal findings: Secondary | ICD-10-CM

## 2024-04-11 NOTE — Progress Notes (Signed)
 " Subjective:    CC: Annual Physical Exam  HPI: Lance Davis is a 69 y.o. presenting for annual physical  He is also presenting for preoperative evaluation/clearance. The planned procedure is left reverse shoulder arthroplasty with the treatment goal of reduced pain and improved function. Cardiac risk for planned procedure is Intermediate (1 to 5%) - intraperitoneal or intrathoracic surgery, carotid endarterectomy, head and neck surgery, orthopedic surgery, prostate surgery  Signs or symptoms of cardiovascular disease? No New or unstable cardiopulmonary signs or symptoms? No Urinary symptoms or invasive urologic procedure? No  I reviewed the past medical history, family history, social history, surgical history, and allergies today and no changes were needed.  Please see the problem list section below in epic for further details.  Past Medical History: Past Medical History:  Diagnosis Date   Allergy    spring time   Aortic atherosclerosis 11/11/2021   Arthritis    Atypical nevus 04/26/2016   mild - right mid back   Blood transfusion 1963   Cancer (HCC) 2019   melanoma face   Clark level II melanoma (HCC) 12/13/2017   left inner cheek   Palpitations 11/11/2021   Sleep apnea    cpap   Squamous cell carcinoma in situ of skin 04/26/2016   inner left cheek   Past Surgical History: Past Surgical History:  Procedure Laterality Date   COLONOSCOPY     I & D EXTREMITY  07/14/2011   Procedure: IRRIGATION AND DEBRIDEMENT EXTREMITY;  Surgeon: Prentice LELON Pagan, MD;  Location: [facility];  Service: Orthopedics;  Laterality: Left;   POLYPECTOMY     TENDON REPAIR  07/14/2011   Procedure: TENDON REPAIR;  Surgeon: Prentice LELON Pagan, MD;  Location: [facility] OR;  Service: Orthopedics;  Laterality: Left;   Social History: Social History   Socioeconomic History   Marital status: Married    Spouse name: Not on file   Number of children: Not on file   Years of education: Not on file   Highest education  level: Not on file  Occupational History   Not on file  Tobacco Use   Smoking status: Never    Passive exposure: Never   Smokeless tobacco: Never  Vaping Use   Vaping status: Never Used  Substance and Sexual Activity   Alcohol use: Yes    Alcohol/week: 14.0 standard drinks of alcohol    Types: 14 Glasses of wine per week   Drug use: No   Sexual activity: Yes  Other Topics Concern   Not on file  Social History Narrative   Not on file   Social Drivers of Health   Tobacco Use: Low Risk (03/07/2024)   Patient History    Smoking Tobacco Use: Never    Smokeless Tobacco Use: Never    Passive Exposure: Never  Financial Resource Strain: Low Risk (04/11/2024)   Overall Financial Resource Strain (CARDIA)    Difficulty of Paying Living Expenses: Not hard at all  Food Insecurity: No Food Insecurity (04/11/2024)   Epic    Worried About Programme Researcher, Broadcasting/film/video in the Last Year: Never true    Ran Out of Food in the Last Year: Never true  Transportation Needs: No Transportation Needs (04/11/2024)   Epic    Lack of Transportation (Medical): No    Lack of Transportation (Non-Medical): No  Physical Activity: Sufficiently Active (04/11/2024)   Exercise Vital Sign    Days of Exercise per Week: 4 days    Minutes of Exercise per Session:  50 min  Stress: No Stress Concern Present (04/11/2024)   [employer] of Occupational Health - Occupational Stress Questionnaire    Feeling of Stress: Not at all  Social Connections: Moderately Isolated (04/11/2024)   Social Connection and Isolation Panel    Frequency of Communication with Friends and Family: More than three times a week    Frequency of Social Gatherings with Friends and Family: Once a week    Attends Religious Services: Never    Database Administrator or Organizations: No    Attends Banker Meetings: Never    Marital Status: Married  Depression (PHQ2-9): Low Risk (04/11/2024)   Depression (PHQ2-9)    PHQ-2 Score: 0  Alcohol Screen:  Low Risk (04/11/2024)   Alcohol Screen    Last Alcohol Screening Score (AUDIT): 4  Housing: Low Risk (04/11/2024)   Epic    Unable to Pay for Housing in the Last Year: No    Number of Times Moved in the Last Year: 0    Homeless in the Last Year: No  Utilities: Not At Risk (04/11/2024)   Epic    Threatened with loss of utilities: No  Health Literacy: Adequate Health Literacy (04/11/2024)   B1300 Health Literacy    Frequency of need for help with medical instructions: Never   Family History: Family History  Problem Relation Age of Onset   Diabetes Mother    Heart disease Mother        Mom   CAD Mother    Frontotemporal dementia Father    Valvular heart disease Father    Diabetes Sister    Epilepsy Sister    Diabetes Brother    Heart disease Brother        Suddenly died at 85 from Anna parent heart attackP   Valvular heart disease Maternal Uncle    Colon cancer Neg Hx    Colon polyps Neg Hx    Esophageal cancer Neg Hx    Stomach cancer Neg Hx    Rectal cancer Neg Hx    Allergies: Allergies[1] Medications: See med rec.  Review of Systems: No headache, visual changes, nausea, vomiting, diarrhea, constipation, dizziness, abdominal pain, skin rash, fevers, chills, night sweats, swollen lymph nodes, weight loss, chest pain, body aches, joint swelling, muscle aches, shortness of breath, mood changes, visual or auditory hallucinations.  Objective:    BP 132/78 (BP Location: Left Arm, Patient Position: Sitting, Cuff Size: Normal)   Pulse 64   Ht 6' 2.5 (1.892 m)   Wt 240 lb (108.9 kg)   SpO2 96%   BMI 30.40 kg/m   General: Well Developed, well nourished, and in no acute distress.  Neuro: Alert and oriented x3, extra-ocular muscles intact, sensation grossly intact. Cranial nerves II through XII are intact, motor, sensory, and coordinative functions are all intact. HEENT: Normocephalic, atraumatic, pupils equal round reactive to light, neck supple, no masses, no lymphadenopathy,  thyroid nonpalpable. Oropharynx, nasopharynx, external ear canals are unremarkable. Skin: Warm and dry, no rashes noted.  Cardiac: Regular rate and rhythm, no murmurs rubs or gallops.  Respiratory: Clear to auscultation bilaterally. Not using accessory muscles, speaking in full sentences.  Abdominal: Soft, nontender, nondistended, positive bowel sounds, no masses, no organomegaly.  Musculoskeletal: Shoulder, elbow, wrist, hip, knee, ankle stable, and with full range of motion.  Impression and Recommendations:    Wellness examination Assessment & Plan: Routine HCM labs reviewed/ordered, patient does need updated lipid panel and A1c, he will return to have these completed.  HCM reviewed/discussed. Anticipatory guidance regarding healthy weight, lifestyle and choices given. Recommend healthy diet.  Recommend approximately 150 minutes/week of moderate intensity exercise Recommend regular dental and vision exams Always use seatbelt/lap and shoulder restraints Recommend using smoke alarms and checking batteries at least twice a year Recommend using sunscreen when outside Discussed colon cancer screening recommendations.  Patient up-to-date currently Discussed recommendations for shingles vaccine.  Patient UTD. Discussed immunization recommendations - patient states that he believes he had pneumonia vaccine since turning 64 and he will look for records to provide us  regarding this PSA followed with urology   Preoperative clearance Assessment & Plan: Based on history and exam will order the following preoperative tests: A1c to ensure underlying diabetes is appropriately controlled. Medical clearance for surgery pending results of above tests. Once received, will review and make determination of medical clearance.   Return in about 4 months (around 08/09/2024) for diabetes.   ___________________________________________ Danyka Merlin de [place], MD, ABFM, CAQSM Primary Care and Sports Medicine [facility]    [1] No Known Allergies  "

## 2024-04-12 ENCOUNTER — Other Ambulatory Visit (HOSPITAL_BASED_OUTPATIENT_CLINIC_OR_DEPARTMENT_OTHER): Payer: Self-pay | Admitting: Family Medicine

## 2024-04-12 ENCOUNTER — Other Ambulatory Visit (HOSPITAL_BASED_OUTPATIENT_CLINIC_OR_DEPARTMENT_OTHER): Payer: Self-pay

## 2024-04-12 DIAGNOSIS — Z01818 Encounter for other preprocedural examination: Secondary | ICD-10-CM | POA: Insufficient documentation

## 2024-04-12 MED ORDER — ATORVASTATIN CALCIUM 40 MG PO TABS
40.0000 mg | ORAL_TABLET | Freq: Every day | ORAL | 0 refills | Status: DC
  Filled 2024-04-12: qty 90, 90d supply, fill #0

## 2024-04-12 NOTE — Assessment & Plan Note (Signed)
 Routine HCM labs reviewed/ordered, patient does need updated lipid panel and A1c, he will return to have these completed. HCM reviewed/discussed. Anticipatory guidance regarding healthy weight, lifestyle and choices given. Recommend healthy diet.  Recommend approximately 150 minutes/week of moderate intensity exercise Recommend regular dental and vision exams Always use seatbelt/lap and shoulder restraints Recommend using smoke alarms and checking batteries at least twice a year Recommend using sunscreen when outside Discussed colon cancer screening recommendations.  Patient up-to-date currently Discussed recommendations for shingles vaccine.  Patient UTD. Discussed immunization recommendations - patient states that he believes he had pneumonia vaccine since turning 64 and he will look for records to provide us  regarding this PSA followed with urology

## 2024-04-12 NOTE — Assessment & Plan Note (Signed)
 Based on history and exam will order the following preoperative tests: A1c to ensure underlying diabetes is appropriately controlled. Medical clearance for surgery pending results of above tests. Once received, will review and make determination of medical clearance.

## 2024-04-16 ENCOUNTER — Other Ambulatory Visit (HOSPITAL_COMMUNITY): Payer: Self-pay

## 2024-04-16 ENCOUNTER — Other Ambulatory Visit (HOSPITAL_BASED_OUTPATIENT_CLINIC_OR_DEPARTMENT_OTHER): Payer: Self-pay

## 2024-04-16 ENCOUNTER — Other Ambulatory Visit (HOSPITAL_BASED_OUTPATIENT_CLINIC_OR_DEPARTMENT_OTHER): Payer: Self-pay | Admitting: Family Medicine

## 2024-04-16 ENCOUNTER — Other Ambulatory Visit: Payer: Self-pay

## 2024-04-16 DIAGNOSIS — E1169 Type 2 diabetes mellitus with other specified complication: Secondary | ICD-10-CM

## 2024-04-16 MED ORDER — TAMSULOSIN HCL 0.4 MG PO CAPS: 0.4000 mg | ORAL_CAPSULE | Freq: Two times a day (BID) | ORAL | 1 refills | Status: AC

## 2024-04-16 MED ORDER — LOSARTAN POTASSIUM 50 MG PO TABS: 50.0000 mg | ORAL_TABLET | Freq: Every day | ORAL | 3 refills | Status: AC

## 2024-04-16 MED ORDER — METFORMIN HCL 1000 MG PO TABS: 1000.0000 mg | ORAL_TABLET | Freq: Two times a day (BID) | ORAL | 0 refills | Status: AC

## 2024-04-16 MED ORDER — FINASTERIDE 5 MG PO TABS: 5.0000 mg | ORAL_TABLET | Freq: Every day | ORAL | 1 refills | Status: AC

## 2024-04-16 MED ORDER — ATORVASTATIN CALCIUM 40 MG PO TABS: 40.0000 mg | ORAL_TABLET | Freq: Every day | ORAL | 0 refills | Status: AC

## 2024-04-16 NOTE — Telephone Encounter (Unsigned)
 Copied from CRM [id number]. Topic: Clinical - Medication Refill >> Apr 16, 2024 11:53 AM Selinda RAMAN wrote: Medication: tamsulosin  (FLOMAX ) 0.4 MG CAPS capsule, finasteride  (PROSCAR ) 5 MG tablet, metFORMIN  (GLUCOPHAGE ) 1000 MG tablet, atorvastatin  (LIPITOR) 40 MG tablet  Has the patient contacted their pharmacy? No   This is the patient's preferred pharmacy:    [id number] - [place], [place] - [address] AT [facility] [address] [place] [address] Phone: [phone] Fax: [phone]  Is this the correct pharmacy for this prescription? Yes If no, delete pharmacy and type the correct one.   Has the prescription been filled recently? Yes  Is the patient out of the medication? Yes  Has the patient been seen for an appointment in the last year OR does the patient have an upcoming appointment? Yes  Can we respond through MyChart? Yes  The patient called from [place]  stating he forgot his medications and needs at least a temporary fill to please be called into the local pharmacy there. Please assist patient further

## 2024-04-16 NOTE — Progress Notes (Signed)
 Pt forgot medication and requested a 5 day refill while he is on vacation. Sent medication refill for 5 days to his nearest pharmacy.

## 2024-04-17 ENCOUNTER — Other Ambulatory Visit (HOSPITAL_COMMUNITY): Payer: Self-pay

## 2024-04-17 MED ORDER — TAMSULOSIN HCL 0.4 MG PO CAPS
0.4000 mg | ORAL_CAPSULE | Freq: Two times a day (BID) | ORAL | 1 refills | Status: AC
  Filled 2024-04-17 – 2024-04-22 (×2): qty 180, 90d supply, fill #0

## 2024-04-22 ENCOUNTER — Other Ambulatory Visit (HOSPITAL_COMMUNITY): Payer: Self-pay

## 2024-04-23 ENCOUNTER — Other Ambulatory Visit: Payer: Self-pay

## 2024-04-24 ENCOUNTER — Telehealth: Payer: Self-pay | Admitting: Pulmonary Disease

## 2024-04-24 NOTE — Telephone Encounter (Signed)
 Left voicemail to call back to schedule cpap compliance after starting CPAP machine. Will need a follow up scheduled between 05/24/2024 and 07/22/2024.

## 2024-05-07 ENCOUNTER — Other Ambulatory Visit: Payer: Self-pay | Admitting: Pulmonary Disease

## 2024-05-07 ENCOUNTER — Other Ambulatory Visit (HOSPITAL_BASED_OUTPATIENT_CLINIC_OR_DEPARTMENT_OTHER): Payer: Self-pay

## 2024-05-07 ENCOUNTER — Other Ambulatory Visit (HOSPITAL_COMMUNITY): Payer: Self-pay

## 2024-05-07 DIAGNOSIS — F5104 Psychophysiologic insomnia: Secondary | ICD-10-CM

## 2024-05-07 MED ORDER — METFORMIN HCL 1000 MG PO TABS: 1000.0000 mg | ORAL_TABLET | Freq: Two times a day (BID) | ORAL | 0 refills | Status: AC

## 2024-05-07 MED ORDER — FINASTERIDE 5 MG PO TABS: 5.0000 mg | ORAL_TABLET | Freq: Every day | ORAL | 0 refills | Status: AC

## 2024-05-08 ENCOUNTER — Other Ambulatory Visit (HOSPITAL_COMMUNITY): Payer: Self-pay

## 2024-05-08 MED ORDER — ZOLPIDEM TARTRATE 10 MG PO TABS: 10.0000 mg | ORAL_TABLET | Freq: Every evening | ORAL | 5 refills | Status: AC | PRN

## 2024-05-09 ENCOUNTER — Other Ambulatory Visit (HOSPITAL_BASED_OUTPATIENT_CLINIC_OR_DEPARTMENT_OTHER): Payer: Self-pay

## 2024-08-09 ENCOUNTER — Ambulatory Visit (HOSPITAL_BASED_OUTPATIENT_CLINIC_OR_DEPARTMENT_OTHER): Admitting: Family Medicine
